# Patient Record
Sex: Female | Born: 1935
Health system: Southern US, Community
[De-identification: ages and names within clinical notes are randomized; demographics above are authoritative.]

## PROBLEM LIST (undated history)

## (undated) DIAGNOSIS — I471 Supraventricular tachycardia, unspecified: Secondary | ICD-10-CM

## (undated) DIAGNOSIS — C4490 Unspecified malignant neoplasm of skin, unspecified: Secondary | ICD-10-CM

## (undated) DIAGNOSIS — E785 Hyperlipidemia, unspecified: Secondary | ICD-10-CM

## (undated) DIAGNOSIS — E039 Hypothyroidism, unspecified: Secondary | ICD-10-CM

## (undated) HISTORY — PX: OTHER SURGICAL HISTORY: SHX169

## (undated) HISTORY — DX: Supraventricular tachycardia: I47.1

## (undated) HISTORY — DX: Supraventricular tachycardia, unspecified: I47.10

## (undated) HISTORY — DX: Unspecified malignant neoplasm of skin, unspecified: C44.90

## (undated) HISTORY — DX: Hypothyroidism, unspecified: E03.9

## (undated) HISTORY — PX: BUNIONECTOMY: SHX129

## (undated) HISTORY — DX: Hyperlipidemia, unspecified: E78.5

---

## 1997-11-07 ENCOUNTER — Ambulatory Visit (HOSPITAL_COMMUNITY): Admission: RE | Admit: 1997-11-07 | Discharge: 1997-11-07 | Payer: Self-pay | Admitting: *Deleted

## 2001-07-08 ENCOUNTER — Ambulatory Visit (HOSPITAL_COMMUNITY): Admission: RE | Admit: 2001-07-08 | Discharge: 2001-07-08 | Payer: Self-pay | Admitting: Gastroenterology

## 2003-06-26 ENCOUNTER — Ambulatory Visit (HOSPITAL_BASED_OUTPATIENT_CLINIC_OR_DEPARTMENT_OTHER): Admission: RE | Admit: 2003-06-26 | Discharge: 2003-06-26 | Payer: Self-pay | Admitting: Orthopedic Surgery

## 2010-12-11 ENCOUNTER — Emergency Department (HOSPITAL_COMMUNITY): Payer: Medicare Other

## 2010-12-11 ENCOUNTER — Emergency Department (HOSPITAL_COMMUNITY)
Admission: EM | Admit: 2010-12-11 | Discharge: 2010-12-11 | Disposition: A | Discharge status: Home / Self Care | Payer: Medicare Other | Attending: Emergency Medicine | Admitting: Emergency Medicine

## 2010-12-11 DIAGNOSIS — W010XXA Fall on same level from slipping, tripping and stumbling without subsequent striking against object, initial encounter: Secondary | ICD-10-CM | POA: Insufficient documentation

## 2010-12-11 DIAGNOSIS — Z23 Encounter for immunization: Secondary | ICD-10-CM | POA: Insufficient documentation

## 2010-12-11 DIAGNOSIS — F329 Major depressive disorder, single episode, unspecified: Secondary | ICD-10-CM | POA: Insufficient documentation

## 2010-12-11 DIAGNOSIS — E039 Hypothyroidism, unspecified: Secondary | ICD-10-CM | POA: Insufficient documentation

## 2010-12-11 DIAGNOSIS — F3289 Other specified depressive episodes: Secondary | ICD-10-CM | POA: Insufficient documentation

## 2010-12-11 DIAGNOSIS — M25579 Pain in unspecified ankle and joints of unspecified foot: Secondary | ICD-10-CM | POA: Insufficient documentation

## 2010-12-11 DIAGNOSIS — Y9301 Activity, walking, marching and hiking: Secondary | ICD-10-CM | POA: Insufficient documentation

## 2010-12-11 DIAGNOSIS — IMO0002 Reserved for concepts with insufficient information to code with codable children: Secondary | ICD-10-CM | POA: Insufficient documentation

## 2010-12-11 DIAGNOSIS — M25569 Pain in unspecified knee: Secondary | ICD-10-CM | POA: Insufficient documentation

## 2010-12-11 DIAGNOSIS — M25539 Pain in unspecified wrist: Secondary | ICD-10-CM | POA: Insufficient documentation

## 2010-12-11 DIAGNOSIS — S63509A Unspecified sprain of unspecified wrist, initial encounter: Secondary | ICD-10-CM | POA: Insufficient documentation

## 2011-06-03 ENCOUNTER — Other Ambulatory Visit: Payer: Self-pay

## 2011-06-03 ENCOUNTER — Emergency Department (HOSPITAL_COMMUNITY)
Admission: EM | Admit: 2011-06-03 | Discharge: 2011-06-03 | Disposition: A | Discharge status: Home / Self Care | Payer: Medicare Other | Attending: Emergency Medicine | Admitting: Emergency Medicine

## 2011-06-03 ENCOUNTER — Encounter (HOSPITAL_COMMUNITY): Payer: Self-pay | Admitting: *Deleted

## 2011-06-03 DIAGNOSIS — Z79899 Other long term (current) drug therapy: Secondary | ICD-10-CM | POA: Insufficient documentation

## 2011-06-03 DIAGNOSIS — I471 Supraventricular tachycardia, unspecified: Secondary | ICD-10-CM

## 2011-06-03 DIAGNOSIS — Z7982 Long term (current) use of aspirin: Secondary | ICD-10-CM | POA: Insufficient documentation

## 2011-06-03 DIAGNOSIS — E039 Hypothyroidism, unspecified: Secondary | ICD-10-CM | POA: Insufficient documentation

## 2011-06-03 DIAGNOSIS — R002 Palpitations: Secondary | ICD-10-CM | POA: Insufficient documentation

## 2011-06-03 MED ORDER — ADENOSINE 6 MG/2ML IV SOLN
INTRAVENOUS | Status: AC
  Administered 2011-06-03: 6 mg
  Filled 2011-06-03: qty 6

## 2011-06-03 MED ORDER — ADENOSINE 6 MG/2ML IV SOLN: 6.0000 mg | Freq: Once | INTRAVENOUS | Status: DC

## 2011-06-03 NOTE — ED Provider Notes (Signed)
History     CSN: [id number]  Arrival date & time 06/03/11  1139   First MD Initiated Contact with Patient 06/03/11 1150      Chief Complaint  Patient presents with  . Irregular Heart Beat    SVT    (Consider location/radiation/quality/duration/timing/severity/associated sxs/prior treatment) HPI Comments: Level 5 caveat due to urgent need for intervention. 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 She reports that she has a history of tachycardia but has never required any kind of medical intervention. She reports usually goes away on its own after a brief period of time. She reports no chest pain, tightness, shortness of breath, nausea, sweats. She reports that she has a history of hypothyroidism and she takes Synthroid. She reports that she did drink 3 cups of coffee this morning. Otherwise she denies fevers, chills, sore throat, cough, abdominal pain or back pain. She denies any long distance travel recently and no lower extremity swelling or calf tenderness. Patient reports she took her pulse and she thought that it was around 120. She spoke to someone there in the nursing unit who took her pulse and vital signs and found that her heart rate was much faster than 120 and the patient was urged to come down here to the emergency department. Currently on the monitor her heart rate is 180.  The history is provided by the patient.    Past Medical History  Diagnosis Date  . Tachycardia   . Thyroid disease     History reviewed. No pertinent past surgical history.  No family history on file.  History  Substance Use Topics  . Smoking status: Never Smoker   . Smokeless tobacco: Not on file  . Alcohol Use: No    OB History    Grav Para Term Preterm Abortions TAB SAB Ect Mult Living                  Review of Systems  Unable to perform ROS Cardiovascular: Positive for palpitations.    All other systems reviewed and are negative.    Allergies  Codeine  Home Medications   Current Outpatient Rx  Name Route Sig Dispense Refill  . ASPIRIN 81 MG PO TABS Oral Take 160 mg by mouth daily.    Marland Kitchen VITAMIN D 1000 UNITS PO TABS Oral Take 1,000 Units by mouth daily.    Marland Kitchen LEVOTHYROXINE SODIUM 100 MCG PO TABS Oral Take 100 mcg by mouth daily.    Carma Leaven M PLUS PO TABS Oral Take 1 tablet by mouth daily.      BP 122/79  Pulse 96  Resp 18  SpO2 96%  Physical Exam  Vitals reviewed. Constitutional: She is oriented to person, place, and time. She appears well-developed and well-nourished.  HENT:  Head: Normocephalic and atraumatic.  Eyes: Pupils are equal, round, and reactive to light.  Neck: Normal range of motion. Neck supple.  Cardiovascular:  No extrasystoles are present. Tachycardia present.   No murmur heard. Pulmonary/Chest: Effort normal. No respiratory distress. She has no wheezes.  Abdominal: Soft. She exhibits no distension. There is no tenderness.  Musculoskeletal: She exhibits no edema and no tenderness.  Neurological: She is alert and oriented to person, place, and time.  Skin: Skin is warm and dry. No pallor.  Psychiatric: She has a normal mood and affect.    ED Course  CARDIOVERSION Date/Time: 06/03/2011 12:02  PM Performed by: Lear Ng Authorized by: Lear Ng Consent: Verbal consent obtained. Risks and benefits: risks, benefits and alternatives were discussed Consent given by: patient Patient understanding: patient states understanding of the procedure being performed Relevant documents: relevant documents present and verified Imaging studies: imaging studies not available Patient identity confirmed: verbally with patient Time out: Immediately prior to procedure a "time out" was called to verify the correct patient, procedure, equipment, support staff and site/side marked as required. Patient sedated: no Cardioversion basis:  emergent Pre-procedure rhythm: supraventricular tachycardia Number of attempts: 1 Attempt 1 outcome: conversion to normal sinus rhythm Post-procedure rhythm: normal sinus rhythm Patient tolerance: Patient tolerated the procedure well with no immediate complications. Comments: 6 mg adenosine bolus was used, successfully   (including critical care time)  Labs Reviewed - No data to display No results found.   No diagnosis found. Room air saturation was 96% which is normal.   MDM   Patient's EKG performed at 11:41, shows a supraventricular tachycardia at a rate of 180. Axis is normal. There is some nonspecific ST depression noted inferiorly. No prior EKGs are currently available.  Patient denied any current chest pain however given her age, I did not want the patient to remain at such a rapid heart rate for a prolonged period of time. Clinically, I did not think that this was a acute thyrotoxicosis picture, and I did not think this was acute coronary syndrome. The patient was given an injection of 6 mg of adenosine with rapid saline flush. Patient's monitor rhythm became a sinus bradycardia which then turned into a sinus tachycardia. Most recently, the patient remains in a normal sinus rhythm. Repeat EKG performed at 12:16, shows normal sinus rhythm at a rate of 94. There is early RS transition noted in lead V2. There is again normal axis and ST abnormalities seen inferiorly are resolved.  Patient remains in stable condition with stable vital signs and she reports feeling much improved. She has been monitored here for more than one hour with no recurrence of her symptoms. She is told to decrease her caffeine intake. She can followup with her primary care physician Dr. Wylene Simmer next week as needed.      1:43 PM Patient remains hemodynamically stable. She reports no chest tightness, chest pain, shortness of breath. Her heart rate remains in a normal sinus rhythm in the 90s. I've spoken to her  primary care physician whose office will contact her tomorrow to arrange close outpatient followup. She and her family know to return for any worsening symptoms or problems. She'll be discharged home at this time the  Gavin Pound. Gae Bihl, MD 06/03/11 1347

## 2011-06-03 NOTE — ED Notes (Signed)
Pt sts that heart stated racing about 1045.  Pt used to take inderal for tachycardia before, but not now.   Pt reports something feeling in chest but cannot describe it as chest pain

## 2011-06-03 NOTE — Discharge Instructions (Signed)
 Supraventricular Tachycardia Supraventricular tachycardia (SVT) is an abnormal heart rhythm (arrhythmia) that causes the heart to beat very fast (tachycardia). This kind of fast heart beat originates in the upper chambers of the heart (atria). SVT can cause the heart to beat greater than 100 beats per minute. SVT can have a rapid burst of heartbeats. This can start and stop suddenly without warning and is called non-sustained. SVT can also be sustained, in which the heart beats at a continuous fast rate.  CAUSES  There can be different causes of SVT. Some of these include:  Heart valve problems such as mitral valve prolapse.   An enlarged heart (hypertrophic cardiomyopathy).   Congenital heart problems.   Heart inflammation (pericarditis).   Hyperthyroidism.   Low potassium or magnesium levels.   Caffeine.   Drug use such as cocaine, methamphetamines, or stimulants.   Some over-the-counter medications such as:   Decongestants.   Diet medications.   Herbal medications.  SYMPTOMS  Symptoms of SVT can vary. Symptoms depend on if the SVT is sustained or non-sustained. These can include:  No symptoms (asymptomatic).   An awareness of your heart beating rapidly (palpitations).   Shortness of breath.   Chest pain or pressure.   If your blood pressure drops because of the SVT, you may experience:   Fainting or near fainting.   Weakness.   Dizziness.  DIAGNOSIS  Different tests can be performed to diagnose SVT, such as:  An electrocardiogram (EKG) is a painless test that records the electrical activity of your heart.   Holter monitor. This is a 24 hour recording of your heart rhythm. You will be given a diary. Write down all symptoms that you have and what you were doing at the time you experienced symptoms.   Arrhythmia monitor. This is a small device that your wear for several weeks. It records the heart rhythm when you have symptoms.   Echocardiogram. This is an  imaging test to help detect abnormal heart structure such as congenital abnormalities, heart valve problems, or if your heart is enlarged.   Stress Test. This test can help determine if the SVT is related to exercise.   Electrophysiology Study (EPS). This is a procedure that evaluates your heart's electrical system and can help your caregiver find the cause of SVT.  TREATMENT  Treatment of SVT depends on the symptoms, how often it recurs and whether there are any underlying heart problems.   If symptoms are rare and no other cardiac disease is present, no treatment may be needed.   Blood work may be done to check potassium, magnesium and thyroid  hormone levels to see if they are abnormal. If these levels are abnormal, treatment to correct the problems will occur.  Medications: Your caregiver may use oral medications to treat SVT. These medications are given for long-term control of SVT. Medications may be used alone or in combination with other treatments. These medications work to slow nerve impulses in the heart muscle. These medications can also be used to treat high blood pressure. Some of these medications may include:  Calcium channel blockers.   Beta blockers.   Lanoxin.  Nonsurgical procedures: Nonsurgical techniques may be used if oral medications do not work. Some examples include:  Cardioversion. This technique uses either drugs or an electrical shock to restore a normal heart rhythm.   Cardioversion drugs may be given through an intravenous (IV) line to help reset the heart rhythm.   In electrical cardio version, the doctor shocks  your heart to stop its beat for a split second. This helps to reset the heart to a normal rhythm.   Ablation. This procedure is done under mild sedation. High frequency radio-wave energy is used to destroy the area of heart tissue responsible for the SVT.  HOME CARE INSTRUCTIONS   Do not smoke.   Only take medications prescribed by your  caregiver. Check with your caregiver before using over-the-counter medications.   Check with your caregiver as to how much alcohol  and caffeine (coffee, tea, colas, or chocolate) you may have.   It is very important to keep all follow-up referrals and appointments in order to properly manage this problem.  SEEK IMMEDIATE MEDICAL CARE IF:  You have dizziness.   You faint or nearly faint.   You have shortness of breath.   You have chest pain or pressure.   You have sudden nausea or vomiting.   You have profuse sweating.   You are concerned about how long your symptoms last.   You are concerned about the frequency of your SVT episodes.  If you have the above symptoms, call your local emergency service immediately! Do not drive yourself to the hospital. MAKE SURE YOU:   Understand these instructions.   Will watch your condition.   Will get help right away if you are not doing well or get worse.  Document Released: 04/27/2005 Document Revised: 11/10/2010 Document Reviewed: 08/09/2008 [facility] Patient Information 2012 [place], [place].Supraventricular Tachycardia Supraventricular tachycardia (SVT) is an abnormal heart rhythm (arrhythmia) that causes the heart to beat very fast (tachycardia). This kind of fast heart beat originates in the upper chambers of the heart (atria). SVT can cause the heart to beat greater than 100 beats per minute. SVT can have a rapid burst of heartbeats. This can start and stop suddenly without warning and is called non-sustained. SVT can also be sustained, in which the heart beats at a continuous fast rate.  CAUSES  There can be different causes of SVT. Some of these include:  Heart valve problems such as mitral valve prolapse.   An enlarged heart (hypertrophic cardiomyopathy).   Congenital heart problems.   Heart inflammation (pericarditis).   Hyperthyroidism.   Low potassium or magnesium levels.   Caffeine.   Drug use such as cocaine,  methamphetamines, or stimulants.   Some over-the-counter medications such as:   Decongestants.   Diet medications.   Herbal medications.  SYMPTOMS  Symptoms of SVT can vary. Symptoms depend on if the SVT is sustained or non-sustained. These can include:  No symptoms (asymptomatic).   An awareness of your heart beating rapidly (palpitations).   Shortness of breath.   Chest pain or pressure.   If your blood pressure drops because of the SVT, you may experience:   Fainting or near fainting.   Weakness.   Dizziness.  DIAGNOSIS  Different tests can be performed to diagnose SVT, such as:  An electrocardiogram (EKG) is a painless test that records the electrical activity of your heart.   Holter monitor. This is a 24 hour recording of your heart rhythm. You will be given a diary. Write down all symptoms that you have and what you were doing at the time you experienced symptoms.   Arrhythmia monitor. This is a small device that your wear for several weeks. It records the heart rhythm when you have symptoms.   Echocardiogram. This is an imaging test to help detect abnormal heart structure such as congenital abnormalities, heart valve problems, or if  your heart is enlarged.   Stress Test. This test can help determine if the SVT is related to exercise.   Electrophysiology Study (EPS). This is a procedure that evaluates your heart's electrical system and can help your caregiver find the cause of SVT.  TREATMENT  Treatment of SVT depends on the symptoms, how often it recurs and whether there are any underlying heart problems.   If symptoms are rare and no other cardiac disease is present, no treatment may be needed.   Blood work may be done to check potassium, magnesium and thyroid  hormone levels to see if they are abnormal. If these levels are abnormal, treatment to correct the problems will occur.  Medications: Your caregiver may use oral medications to treat SVT. These  medications are given for long-term control of SVT. Medications may be used alone or in combination with other treatments. These medications work to slow nerve impulses in the heart muscle. These medications can also be used to treat high blood pressure. Some of these medications may include:  Calcium channel blockers.   Beta blockers.   Lanoxin.  Nonsurgical procedures: Nonsurgical techniques may be used if oral medications do not work. Some examples include:  Cardioversion. This technique uses either drugs or an electrical shock to restore a normal heart rhythm.   Cardioversion drugs may be given through an intravenous (IV) line to help reset the heart rhythm.   In electrical cardio version, the doctor shocks your heart to stop its beat for a split second. This helps to reset the heart to a normal rhythm.   Ablation. This procedure is done under mild sedation. High frequency radio-wave energy is used to destroy the area of heart tissue responsible for the SVT.  HOME CARE INSTRUCTIONS   Do not smoke.   Only take medications prescribed by your caregiver. Check with your caregiver before using over-the-counter medications.   Check with your caregiver as to how much alcohol  and caffeine (coffee, tea, colas, or chocolate) you may have.   It is very important to keep all follow-up referrals and appointments in order to properly manage this problem.  SEEK IMMEDIATE MEDICAL CARE IF:  You have dizziness.   You faint or nearly faint.   You have shortness of breath.   You have chest pain or pressure.   You have sudden nausea or vomiting.   You have profuse sweating.   You are concerned about how long your symptoms last.   You are concerned about the frequency of your SVT episodes.  If you have the above symptoms, call your local emergency service immediately! Do not drive yourself to the hospital. MAKE SURE YOU:   Understand these instructions.   Will watch your condition.     Will get help right away if you are not doing well or get worse.  Document Released: 04/27/2005 Document Revised: 11/10/2010 Document Reviewed: 08/09/2008 [facility] Patient Information 2012 [place], [place].

## 2011-06-26 ENCOUNTER — Encounter: Payer: Self-pay | Admitting: Cardiology

## 2011-06-26 ENCOUNTER — Ambulatory Visit (INDEPENDENT_AMBULATORY_CARE_PROVIDER_SITE_OTHER): Payer: Medicare Other | Admitting: Cardiology

## 2011-06-26 VITALS — BP 140/78 | HR 72 | Ht 63.0 in | Wt 142.0 lb

## 2011-06-26 DIAGNOSIS — I471 Supraventricular tachycardia: Secondary | ICD-10-CM

## 2011-06-26 DIAGNOSIS — I498 Other specified cardiac arrhythmias: Secondary | ICD-10-CM

## 2011-06-26 DIAGNOSIS — I10 Essential (primary) hypertension: Secondary | ICD-10-CM | POA: Insufficient documentation

## 2011-06-26 NOTE — Assessment & Plan Note (Signed)
We discussed the pathophysiology of this. At this point this does not seem to be overly burdensome were symptomatic. I have asked her to pay particular attention to the frequency and duration of these episodes. I would consider event monitoring and we did discuss ablation which at this point is not indicated. I reviewed with her vagal maneuvers. I will check an echocardiogram though I suspect she has a structurally normal heart.

## 2011-06-26 NOTE — Progress Notes (Signed)
HPI The patient presents for evaluation of tachycardia. She has had no past cardiac history. However, she has noted for years some tachycardia palpitations. It happens he stopped what she's doing and rests it will go away. She's not quantified before. It's never caused chest discomfort presyncope or syncope. She doesn't think is getting any worse. 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 She asked one of her coworkers checked her pulse and because he was 180 they sent her to the emergency room. There, she was noted to have SVT with a rate of 180.  She was cardioverted with adenosine. Since that time she hasn't had any sustained episodes as far as she can recall. She says her thyroid was checked and it was normal. I have reviewed the emergency room records and other labs were unremarkable. She denies any chest pressure, neck or arm discomfort. She doesn't have any excessive shortness of breath, PND or orthopnea. She does walk for exercise. She doesn't sleep well and has had some stress in her life. She does feel fatigued more than she used to.   Allergies  Allergen Reactions  . Codeine Nausea And Vomiting    Current Outpatient Prescriptions  Medication Sig Dispense Refill  . aspirin 81 MG tablet Take 160 mg by mouth daily.      . cholecalciferol (VITAMIN D) 1000 UNITS tablet Take 1,000 Units by mouth daily.      Marland Kitchen levothyroxine (SYNTHROID, LEVOTHROID) 100 MCG tablet Take 100 mcg by mouth daily.      . metoprolol succinate (TOPROL-XL) 25 MG 24 hr tablet Take 25 mg by mouth daily.      . Multiple Vitamins-Minerals (MULTIVITAMINS THER. W/MINERALS) TABS Take 1 tablet by mouth daily.        Past Medical History  Diagnosis Date  . Tachycardia   . Thyroid disease     No past surgical history on file.  No family history on file.  History   Social History  . Marital Status: Married    Spouse Name: N/A   Number of Children: N/A  . Years of Education: N/A   Occupational History  . Not on file.   Social History Main Topics  . Smoking status: Never Smoker   . Smokeless tobacco: Not on file  . Alcohol Use: No  . Drug Use: No  . Sexually Active:    Other Topics Concern  . Not on file   Social History Narrative  . No narrative on file    ROS:  Positive for joint pains and rhinitis. Otherwise as stated in the HPI and negative for all other systems.  PHYSICAL EXAM BP 140/78  Pulse 72  Ht 5\' 3"  (1.6 m)  Wt 142 lb (64.411 kg)  BMI 25.15 kg/m2 GENERAL:  Well appearing HEENT:  Pupils equal round and reactive, fundi not visualized, oral mucosa unremarkable NECK:  No jugular venous distention, waveform within normal limits, carotid upstroke brisk and symmetric, no bruits, no thyromegaly LYMPHATICS:  No cervical, inguinal adenopathy LUNGS:  Clear to auscultation bilaterally BACK:  No CVA tenderness CHEST:  Unremarkable HEART:  PMI not displaced or sustained,S1 and S2 within normal limits, no S3, no S4, no clicks, no rubs, no murmurs ABD:  Flat, positive bowel sounds normal in frequency in pitch, no bruits, no rebound, no guarding, no midline pulsatile mass, no hepatomegaly, no splenomegaly EXT:  2 plus pulses throughout, no edema, no cyanosis  no clubbing SKIN:  No rashes no nodules NEURO:  Cranial nerves II through XII grossly intact, motor grossly intact throughout PSYCH:  Cognitively intact, oriented to person place and time   ASSESSMENT AND PLAN

## 2011-06-26 NOTE — Patient Instructions (Signed)
Your physician has requested that you have an echocardiogram. Echocardiography is a painless test that uses sound waves to create images of your heart. It provides your doctor with information about the size and shape of your heart and how well your heart's chambers and valves are working. This procedure takes approximately one hour. There are no restrictions for this procedure.  The current medical regimen is effective;  continue present plan and medications.  Follow up with Dr Antoine Poche in 4 months

## 2011-06-26 NOTE — Assessment & Plan Note (Signed)
Her blood pressure is upper limits of normal. She does not have a functioning blood pressure cuff but I discussed that it might be reasonable to get one of these. She will keep a blood pressure diary.

## 2011-07-02 ENCOUNTER — Other Ambulatory Visit: Payer: Self-pay

## 2011-07-02 ENCOUNTER — Ambulatory Visit (HOSPITAL_COMMUNITY): Payer: Medicare Other | Attending: Cardiology

## 2011-07-02 DIAGNOSIS — I079 Rheumatic tricuspid valve disease, unspecified: Secondary | ICD-10-CM | POA: Insufficient documentation

## 2011-07-02 DIAGNOSIS — I10 Essential (primary) hypertension: Secondary | ICD-10-CM | POA: Insufficient documentation

## 2011-07-02 DIAGNOSIS — I471 Supraventricular tachycardia, unspecified: Secondary | ICD-10-CM | POA: Insufficient documentation

## 2011-08-11 ENCOUNTER — Encounter: Payer: Self-pay | Admitting: Cardiovascular Disease

## 2011-08-11 ENCOUNTER — Ambulatory Visit (INDEPENDENT_AMBULATORY_CARE_PROVIDER_SITE_OTHER): Payer: Medicare Other | Admitting: Cardiovascular Disease

## 2011-08-11 VITALS — BP 110/70 | HR 70 | Ht 63.0 in | Wt 148.8 lb

## 2011-08-11 DIAGNOSIS — I498 Other specified cardiac arrhythmias: Secondary | ICD-10-CM

## 2011-08-11 DIAGNOSIS — I471 Supraventricular tachycardia: Secondary | ICD-10-CM

## 2011-08-11 NOTE — Assessment & Plan Note (Signed)
Patient is asymptomatic at present. She inquired about whether she needed to be on metoprolol succinate long-term. She is tolerating this well and her SVT has been quiescent, I advised her to stay on it. She will followup in one year or sooner if problems arise. If she has recurrent episodes on medical therapy I will plan on referring her to EP.

## 2011-08-11 NOTE — Progress Notes (Signed)
   HPI:  77 year old woman presented for followup evaluation. She was treated for supraventricular tachycardia in the emergency room back in January 2013. The patient was cardioverted with IV adenosine after presenting with chest tightness and palpitations. She has had no recurrence since that time. She was started on long-acting metoprolol. 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 She denies chest pain or pressure with exertion. She denies dyspnea, edema, or lightheadedness. She had an echo showing no abnormalities.  Outpatient Encounter Prescriptions as of 08/11/2011  Medication Sig Dispense Refill  . aspirin 81 MG tablet Take 160 mg by mouth daily.      . calcium carbonate (OS-CAL) 600 MG TABS Take 600 mg by mouth daily.      . cholecalciferol (VITAMIN D) 1000 UNITS tablet Take 1,000 Units by mouth daily.      Marland Kitchen levothyroxine (SYNTHROID, LEVOTHROID) 100 MCG tablet Take 100 mcg by mouth daily.      . metoprolol succinate (TOPROL-XL) 25 MG 24 hr tablet Take 25 mg by mouth daily.      . Multiple Vitamins-Minerals (MULTIVITAMINS THER. W/MINERALS) TABS Take 1 tablet by mouth daily.        Allergies  Allergen Reactions  . Codeine Nausea And Vomiting    Past Medical History  Diagnosis Date  . SVT (supraventricular tachycardia)   . Hypothyroidism   . Osteoporosis   . Dyslipidemia   . Skin cancer     ROS: Negative except as per HPI  BP 110/70  Pulse 70  Ht 5\' 3"  (1.6 m)  Wt 67.495 kg (148 lb 12.8 oz)  BMI 26.36 kg/m2  PHYSICAL EXAM: Pt is alert and oriented, pleasant woman in NAD HEENT: normal Neck: JVP - normal, carotids 2+= without bruits Lungs: CTA bilaterally CV: RRR without murmur or gallop Abd: soft, NT, Positive BS, no hepatomegaly Ext: no C/C/E, distal pulses intact and equal Skin: warm/dry no rash  ASSESSMENT AND PLAN:

## 2011-08-11 NOTE — Patient Instructions (Signed)
Your physician wants you to follow-up in: 1 YEAR with Dr Cooper.  You will receive a reminder letter in the mail two months in advance. If you don't receive a letter, please call our office to schedule the follow-up appointment.  Your physician recommends that you continue on your current medications as directed. Please refer to the Current Medication list given to you today.  

## 2011-11-18 ENCOUNTER — Telehealth: Payer: Self-pay | Admitting: Cardiovascular Disease

## 2011-11-18 NOTE — Telephone Encounter (Signed)
Left message to call back  

## 2011-11-18 NOTE — Telephone Encounter (Signed)
I spoke with the pt and she complains of lower extremity edema. The left leg is a little worse than the right.  The pt is leaving 7/24 to go on a cruise to [place].  The pt said she has been on her feet more than usual due to preparing for trip.  The pt does watch her salt consumption and tries to elevate her feet when able.  The pt said she has had an episode of swelling in the past and it just resolved on it's own.  The pt denies SOB, CP or palpitations at this time.  I have scheduled the pt to come into the office on 11/25/11 for further evaluation.   Pt agreed with plan and will try to put her feet up more often.

## 2011-11-18 NOTE — Telephone Encounter (Signed)
New msg Pt said her feet is swelling. She wanted to talk to you. Please call

## 2011-11-18 NOTE — Telephone Encounter (Signed)
Follow-up:    Patient returned your call.  Please call back. 

## 2011-11-25 ENCOUNTER — Ambulatory Visit: Payer: Medicare Other | Admitting: Nurse Practitioner

## 2012-08-10 ENCOUNTER — Encounter: Payer: Self-pay | Admitting: Cardiovascular Disease

## 2012-08-10 ENCOUNTER — Ambulatory Visit (INDEPENDENT_AMBULATORY_CARE_PROVIDER_SITE_OTHER): Payer: Medicare Other | Admitting: Cardiovascular Disease

## 2012-08-10 VITALS — BP 98/64 | HR 84 | Ht 63.0 in | Wt 142.0 lb

## 2012-08-10 DIAGNOSIS — I498 Other specified cardiac arrhythmias: Secondary | ICD-10-CM

## 2012-08-10 DIAGNOSIS — I471 Supraventricular tachycardia: Secondary | ICD-10-CM

## 2012-08-10 NOTE — Patient Instructions (Addendum)
Your physician wants you to follow-up in: 1 YEAR with Dr Cooper.  You will receive a reminder letter in the mail two months in advance. If you don't receive a letter, please call our office to schedule the follow-up appointment.  Your physician recommends that you continue on your current medications as directed. Please refer to the Current Medication list given to you today.  

## 2012-08-10 NOTE — Progress Notes (Signed)
   HPI:   78 year old woman presenting for followup evaluation. She has a history of supraventricular tachycardia. She had an episode in January 2013 when she was treated with IV adenosine and successfully converted back to sinus rhythm. She was started on long-acting metoprolol at that time.  She is doing well. She's had no palpitations, lightheadedness, edema, or chest pain. She used to get palpitations in stressful situations, but this is resolved since she started metoprolol succinate.  Outpatient Encounter Prescriptions as of 08/10/2012  Medication Sig Dispense Refill  . aspirin 81 MG tablet Take 160 mg by mouth daily.      . calcium carbonate (OS-CAL) 600 MG TABS Take 600 mg by mouth daily.      . cholecalciferol (VITAMIN D) 1000 UNITS tablet Take 1,000 Units by mouth daily.      Marland Kitchen levothyroxine (SYNTHROID, LEVOTHROID) 100 MCG tablet Take 100 mcg by mouth daily.      . metoprolol succinate (TOPROL-XL) 25 MG 24 hr tablet Take 25 mg by mouth daily.      . Multiple Vitamins-Minerals (MULTIVITAMINS THER. W/MINERALS) TABS Take 1 tablet by mouth daily.      . VESICARE 5 MG tablet 5 mg.        No facility-administered encounter medications on file as of 08/10/2012.    Allergies  Allergen Reactions  . Codeine Nausea And Vomiting    Past Medical History  Diagnosis Date  . SVT (supraventricular tachycardia)   . Hypothyroidism   . Osteoporosis   . Dyslipidemia   . Skin cancer     ROS: Negative except as per HPI  BP 98/64  Pulse 84  Ht 5\' 3"  (1.6 m)  Wt 142 lb (64.411 kg)  BMI 25.16 kg/m2  PHYSICAL EXAM: Pt is alert and oriented, NAD HEENT: normal Neck: JVP - normal, carotids 2+= without bruits Lungs: CTA bilaterally CV: RRR without murmur or gallop Abd: soft, NT, Positive BS, no hepatomegaly Ext: no C/C/E, distal pulses intact and equal Skin: warm/dry no rash  EKG:  Normal sinus rhythm 84 beats per minute, within normal limits.  ASSESSMENT AND PLAN: Paroxysmal SVT. This  is quiescent on metoprolol succinate. Echo last year was normal. Continue same medical program. Follow-up in one year.  Tonny Bollman 08/10/2012 3:51 PM

## 2012-10-10 ENCOUNTER — Encounter (HOSPITAL_COMMUNITY): Payer: Self-pay | Admitting: Emergency Medicine

## 2012-10-10 ENCOUNTER — Emergency Department (HOSPITAL_COMMUNITY)
Admission: EM | Admit: 2012-10-10 | Discharge: 2012-10-10 | Disposition: A | Discharge status: Home / Self Care | Payer: Medicare Other | Source: Home / Self Care | Attending: Emergency Medicine | Admitting: Emergency Medicine

## 2012-10-10 DIAGNOSIS — L509 Urticaria, unspecified: Secondary | ICD-10-CM

## 2012-10-10 MED ORDER — PREDNISONE 20 MG PO TABS: 40.0000 mg | ORAL_TABLET | Freq: Every day | ORAL | Status: DC

## 2012-10-10 MED ORDER — HYDROXYZINE HCL 25 MG PO TABS: 25.0000 mg | ORAL_TABLET | Freq: Four times a day (QID) | ORAL | Status: DC

## 2012-10-10 NOTE — ED Notes (Signed)
Pt c/o hives onset 1700 today... Reports working in the yard and started to notice the hives on arms, chest and abdomen Also reports having a tick bite a couple of days ago and just getting over poison ivy Denies: fevers... Not taking any meds for sxs She is alert and oriented w/no signs of acute distress.

## 2012-10-10 NOTE — ED Provider Notes (Signed)
History     CSN: [id number]  Arrival date & time 10/10/12  [place]   First MD Initiated Contact with Patient 10/10/12 1937      Chief Complaint  Patient presents with  . Rash    (Consider location/radiation/quality/duration/timing/severity/associated sxs/prior treatment) HPI Comments: Patient presents to urgent care this evening reporting that a few hours ago after she was working outside in your she started noticing hives and itchy rash in both arms and later extended to her chest and abdomen. Some of her arms have faded away at this point but she's having some worsening itchiness on her abdomen and back. She denies any facial swelling difficulty swallowing or breathing. Denies any fevers generalized malaise arthralgias or myalgias.  Last week she developed poison ivy and she was also working outdoors. Her rash was getting much better  Patient is a 78 y.o. female presenting with rash. The history is provided by the patient.  Rash Pain location:  Generalized Pain quality: not burning and not cramping   Pain severity:  No pain Onset quality:  Sudden Duration:  6 hours Chronicity:  New Relieved by:  Nothing Worsened by:  Nothing tried Associated symptoms: no anorexia, no belching, no chills, no cough, no diarrhea, no dysuria, no fatigue, no fever, no hematemesis, no hematuria, no nausea, no shortness of breath, no sore throat, no vaginal bleeding, no vaginal discharge and no vomiting     Past Medical History  Diagnosis Date  . SVT (supraventricular tachycardia)   . Hypothyroidism   . Osteoporosis   . Dyslipidemia   . Skin cancer     Past Surgical History  Procedure Laterality Date  . Bunionectomy    . Hammer toe repair      Family History  Problem Relation Age of Onset  . Stroke Mother 72  . Alcohol abuse Father 63    History  Substance Use Topics  . Smoking status: Never Smoker   . Smokeless tobacco: Not on file  . Alcohol Use: No    OB History   Grav Para Term  Preterm Abortions TAB SAB Ect Mult Living                  Review of Systems  Constitutional: Negative for fever, chills, activity change, appetite change and fatigue.  HENT: Negative for nosebleeds, congestion, sore throat, neck pain, dental problem, postnasal drip and sinus pressure.   Respiratory: Negative for cough, choking and shortness of breath.   Gastrointestinal: Negative for nausea, vomiting, diarrhea, anorexia and hematemesis.  Genitourinary: Negative for dysuria, hematuria, vaginal bleeding and vaginal discharge.  Musculoskeletal: Negative for myalgias, back pain and arthralgias.  Skin: Positive for rash.  Allergic/Immunologic: Negative for environmental allergies.    Allergies  Codeine  Home Medications   Current Outpatient Rx  Name  Route  Sig  Dispense  Refill  . aspirin 81 MG tablet   Oral   Take 160 mg by mouth daily.         . calcium carbonate (OS-CAL) 600 MG TABS   Oral   Take 600 mg by mouth daily.         . cholecalciferol (VITAMIN D) 1000 UNITS tablet   Oral   Take 1,000 Units by mouth daily.         . hydrOXYzine (ATARAX/VISTARIL) 25 MG tablet   Oral   Take 1 tablet (25 mg total) by mouth every 6 (six) hours.   12 tablet   0   . levothyroxine (SYNTHROID,  LEVOTHROID) 100 MCG tablet   Oral   Take 100 mcg by mouth daily.         . metoprolol succinate (TOPROL-XL) 25 MG 24 hr tablet   Oral   Take 25 mg by mouth daily.         . Multiple Vitamins-Minerals (MULTIVITAMINS THER. W/MINERALS) TABS   Oral   Take 1 tablet by mouth daily.         . predniSONE (DELTASONE) 20 MG tablet   Oral   Take 2 tablets (40 mg total) by mouth daily.   14 tablet   0   . VESICARE 5 MG tablet      5 mg.            BP 138/76  Pulse 60  Temp(Src) 97.6 F (36.4 C) (Oral)  Resp 16  SpO2 98%  Physical Exam  Nursing note and vitals reviewed. Constitutional: Vital signs are normal. She appears well-developed and well-nourished.  Non-toxic  appearance. She does not have a sickly appearance. She does not appear ill. No distress.  HENT:  Head: Normocephalic.  Musculoskeletal: She exhibits no edema and no tenderness.  Neurological: She is alert.  Skin: Skin is warm. Rash noted. There is erythema.       ED Course  Procedures (including critical care time)  Labs Reviewed - No data to display No results found.   1. Urticaria       MDM  Symptoms and exam consistent with urticaria, have prescribed patient a course of 7 days of prednisone along with hydroxyzine.        Jimmie Molly, MD 10/10/12 2051

## 2013-08-21 ENCOUNTER — Ambulatory Visit (INDEPENDENT_AMBULATORY_CARE_PROVIDER_SITE_OTHER): Payer: Medicare Other | Admitting: Cardiovascular Disease

## 2013-08-21 ENCOUNTER — Encounter: Payer: Self-pay | Admitting: Cardiovascular Disease

## 2013-08-21 VITALS — BP 110/58 | HR 70 | Ht 63.0 in | Wt 139.0 lb

## 2013-08-21 DIAGNOSIS — I498 Other specified cardiac arrhythmias: Secondary | ICD-10-CM

## 2013-08-21 DIAGNOSIS — I471 Supraventricular tachycardia: Secondary | ICD-10-CM

## 2013-08-21 NOTE — Progress Notes (Signed)
    HPI:  79 year old woman presenting for followup evaluation. She has a history of supraventricular tachycardia. She had an episode in January 2013 when she was treated with IV adenosine and successfully converted back to sinus rhythm. She was started on long-acting metoprolol at that time. When I saw her last one year ago she had been doing well without palpitations.  The patient has had a good year. She has had problems with her voice and has undergone ENT evaluation. She's had a trial of a proton pump inhibitor but this did not help. Other than that, she has no complaints. She specifically denies chest pain, shortness of breath, edema, orthopnea, PND, lightheadedness, or syncope. She's had one episode of palpitations that was associated with stress and fatigue. This was self-limited. No problems in the last several months.   Outpatient Encounter Prescriptions as of 08/21/2013  Medication Sig  . aspirin 81 MG tablet Take 160 mg by mouth daily.  . calcium carbonate (OS-CAL) 600 MG TABS Take 600 mg by mouth daily.  . cholecalciferol (VITAMIN D) 1000 UNITS tablet Take 1,000 Units by mouth daily.  Marland Kitchen levothyroxine (SYNTHROID, LEVOTHROID) 100 MCG tablet Take 100 mcg by mouth daily.  . metoprolol succinate (TOPROL-XL) 25 MG 24 hr tablet Take 25 mg by mouth daily.  . Multiple Vitamins-Minerals (MULTIVITAMINS THER. W/MINERALS) TABS Take 1 tablet by mouth daily.  . VESICARE 5 MG tablet 5 mg.   . [DISCONTINUED] fluocinonide (LIDEX) 0.05 % external solution   . [DISCONTINUED] hydrOXYzine (ATARAX/VISTARIL) 25 MG tablet Take 1 tablet (25 mg total) by mouth every 6 (six) hours.  . [DISCONTINUED] predniSONE (DELTASONE) 20 MG tablet Take 2 tablets (40 mg total) by mouth daily.    Allergies  Allergen Reactions  . Codeine Nausea And Vomiting    Past Medical History  Diagnosis Date  . SVT (supraventricular tachycardia)   . Hypothyroidism   . Osteoporosis   . Dyslipidemia   . Skin cancer    BP 110/58   Pulse 70  Ht 5\' 3"  (1.6 m)  Wt 139 lb (63.05 kg)  BMI 24.63 kg/m2  PHYSICAL EXAM: Pt is alert and oriented, NAD HEENT: normal Neck: JVP - normal, carotids 2+= without bruits Lungs: CTA bilaterally CV: RRR without murmur or gallop Abd: soft, NT, Positive BS, no hepatomegaly Ext: no C/C/E, distal pulses intact and equal Skin: warm/dry no rash  EKG:  Normal sinus rhythm 71 beats per minute, within normal limits.  ASSESSMENT AND PLAN: Paroxysmal SVT. Only one episode of palpitations in the last year, and this was self-limited. She seems to be doing very well on metoprolol succinate and she will continue with this. I will see her back in one year for followup evaluation. Known to have normal LV function. She will call if any problems arise.  Sherren Mocha 08/21/2013 9:08 AM

## 2013-08-21 NOTE — Patient Instructions (Signed)
Your physician wants you to follow-up in: 1 YEAR with Dr Cooper.  You will receive a reminder letter in the mail two months in advance. If you don't receive a letter, please call our office to schedule the follow-up appointment.  Your physician recommends that you continue on your current medications as directed. Please refer to the Current Medication list given to you today.  

## 2014-04-19 ENCOUNTER — Emergency Department (HOSPITAL_COMMUNITY): Payer: Medicare Other

## 2014-04-19 ENCOUNTER — Emergency Department (HOSPITAL_COMMUNITY)
Admission: EM | Admit: 2014-04-19 | Discharge: 2014-04-19 | Disposition: A | Discharge status: Home / Self Care | Payer: Medicare Other | Attending: Emergency Medicine | Admitting: Emergency Medicine

## 2014-04-19 ENCOUNTER — Encounter (HOSPITAL_COMMUNITY): Payer: Self-pay | Admitting: Emergency Medicine

## 2014-04-19 DIAGNOSIS — R072 Precordial pain: Secondary | ICD-10-CM

## 2014-04-19 DIAGNOSIS — R079 Chest pain, unspecified: Secondary | ICD-10-CM | POA: Diagnosis present

## 2014-04-19 DIAGNOSIS — E039 Hypothyroidism, unspecified: Secondary | ICD-10-CM | POA: Insufficient documentation

## 2014-04-19 DIAGNOSIS — Z85828 Personal history of other malignant neoplasm of skin: Secondary | ICD-10-CM | POA: Insufficient documentation

## 2014-04-19 DIAGNOSIS — Z79899 Other long term (current) drug therapy: Secondary | ICD-10-CM | POA: Insufficient documentation

## 2014-04-19 DIAGNOSIS — I471 Supraventricular tachycardia: Secondary | ICD-10-CM | POA: Diagnosis not present

## 2014-04-19 DIAGNOSIS — Z7982 Long term (current) use of aspirin: Secondary | ICD-10-CM | POA: Insufficient documentation

## 2014-04-19 LAB — CBC
HEMATOCRIT: 40.4 % (ref 36.0–46.0)
HEMOGLOBIN: 13.6 g/dL (ref 12.0–15.0)
MCH: 31.9 pg (ref 26.0–34.0)
MCHC: 33.7 g/dL (ref 30.0–36.0)
MCV: 94.6 fL (ref 78.0–100.0)
Platelets: 252 10*3/uL (ref 150–400)
RBC: 4.27 MIL/uL (ref 3.87–5.11)
RDW: 13.6 % (ref 11.5–15.5)
WBC: 8.3 10*3/uL (ref 4.0–10.5)

## 2014-04-19 LAB — BASIC METABOLIC PANEL
Anion gap: 13 (ref 5–15)
BUN: 28 mg/dL — AB (ref 6–23)
CO2: 26 meq/L (ref 19–32)
Calcium: 10 mg/dL (ref 8.4–10.5)
Chloride: 101 mEq/L (ref 96–112)
Creatinine, Ser: 0.86 mg/dL (ref 0.50–1.10)
GFR calc Af Amer: 74 mL/min — ABNORMAL LOW (ref >90)
GFR, EST NON AFRICAN AMERICAN: 64 mL/min — AB (ref >90)
GLUCOSE: 95 mg/dL (ref 70–99)
POTASSIUM: 4.6 meq/L (ref 3.7–5.3)
SODIUM: 140 meq/L (ref 137–147)

## 2014-04-19 LAB — I-STAT TROPONIN, ED: TROPONIN I, POC: 0.01 ng/mL (ref 0.00–0.08)

## 2014-04-19 MED ORDER — NAPROXEN 250 MG PO TABS
500.0000 mg | ORAL_TABLET | Freq: Once | ORAL | Status: AC
  Administered 2014-04-19: 500 mg via ORAL
  Filled 2014-04-19: qty 2

## 2014-04-19 NOTE — ED Notes (Signed)
C/o intermittent sharp "electrical type" pain to L side of chest with radiation to back and L arm since Monday.  Denies sob, nausea, and vomiting.

## 2014-04-19 NOTE — ED Provider Notes (Signed)
CSN: [id number]     Arrival date & time 04/19/14  1951 History   First MD Initiated Contact with Patient 04/19/14 2032     Chief Complaint  Patient presents with  . Chest Pain     (Consider location/radiation/quality/duration/timing/severity/associated sxs/prior Treatment) HPI Patient is a 79 year old female who presents with chest pain. She says she's been feeling a sharp, "electric" pain in her left upper chest for the past 4 days. She she reports that movement and moving her left arm make it worse. She denies any associated shortness of breath. It is not pleuritic. She has had no rash on her skin. She has never had similar pain before. She denies radiation of this pain. She has had no cough or shortness of breath. She currently has the pain and thinks that it is a 4 out of 10 in severity. Nothing seems to make it better, she has not tried any treatments for it. She has no history of coronary artery disease.   Past Medical History  Diagnosis Date  . SVT (supraventricular tachycardia)   . Hypothyroidism   . Osteoporosis   . Dyslipidemia   . Skin cancer    Past Surgical History  Procedure Laterality Date  . Bunionectomy    . Hammer toe repair     Family History  Problem Relation Age of Onset  . Stroke Mother 1  . Alcohol abuse Father 28   History  Substance Use Topics  . Smoking status: Never Smoker   . Smokeless tobacco: Not on file  . Alcohol Use: No   OB History    No data available     Review of Systems  Constitutional: Negative for fever.  Respiratory: Negative for cough, chest tightness, shortness of breath and wheezing.   Cardiovascular: Positive for chest pain. Negative for palpitations and leg swelling.  Neurological: Negative for numbness and headaches.  All other systems reviewed and are negative.     Allergies  Codeine  Home Medications   Prior to Admission medications   Medication Sig Start Date End Date Taking? Authorizing Provider  aspirin  81 MG tablet Take 160 mg by mouth daily.   Yes Historical Provider, MD  calcium carbonate (OS-CAL) 600 MG TABS Take 600 mg by mouth daily.   Yes Historical Provider, MD  cholecalciferol (VITAMIN D) 1000 UNITS tablet Take 1,000 Units by mouth daily.   Yes Historical Provider, MD  levothyroxine (SYNTHROID, LEVOTHROID) 100 MCG tablet Take 100 mcg by mouth daily.   Yes Historical Provider, MD  metoprolol succinate (TOPROL-XL) 25 MG 24 hr tablet Take 25 mg by mouth daily.   Yes Historical Provider, MD  Multiple Vitamins-Minerals (MULTIVITAMINS THER. W/MINERALS) TABS Take 1 tablet by mouth daily.   Yes Historical Provider, MD  VESICARE 5 MG tablet Take 5 mg by mouth daily.  08/08/12  Yes Historical Provider, MD   BP 133/74 mmHg  Pulse 80  Temp(Src) 98 F (36.7 C) (Oral)  Resp 26  SpO2 98% Physical Exam  Constitutional: She is oriented to person, place, and time. She appears well-developed and well-nourished. No distress.  HENT:  Head: Normocephalic and atraumatic.  Eyes: Pupils are equal, round, and reactive to light.  Cardiovascular: Normal rate and regular rhythm.   No murmur heard. Pulmonary/Chest: Effort normal and breath sounds normal. No respiratory distress.  Tenderness to palpation along the fourth rib, no crepitus noted deformity. No skin changes, no rash on the chest  Abdominal: Soft. Bowel sounds are normal. She exhibits no distension.  There is no tenderness.  Musculoskeletal: Normal range of motion. She exhibits no edema.  Neurological: She is alert and oriented to person, place, and time.  Skin: Skin is warm.  Psychiatric: She has a normal mood and affect.  Nursing note and vitals reviewed.   ED Course  Procedures (including critical care time) Labs Review Labs Reviewed  BASIC METABOLIC PANEL - Abnormal; Notable for the following:    BUN 28 (*)    GFR calc non Af Amer 64 (*)    GFR calc Af Amer 74 (*)    All other components within normal limits  CBC  I-STAT TROPOININ,  ED    Imaging Review Dg Chest 2 View  04/19/2014   CLINICAL DATA:  Intermittent left chest pain for 3 day is  EXAM: CHEST  2 VIEW  COMPARISON:  None.  FINDINGS: Mediastinal contour is normal. The heart size is mildly enlarged. The lungs are hyperinflated. There is no focal infiltrate, pulmonary edema, or pleural effusion. There is kyphosis of spine.  IMPRESSION: No active cardiopulmonary disease.  Emphysema.   Electronically Signed   By: Abelardo Diesel M.D.   On: 04/19/2014 21:50     EKG Interpretation   Date/Time:  Thursday April 19 2014 19:58:57 EST Ventricular Rate:  75 PR Interval:  150 QRS Duration: 94 QT Interval:  418 QTC Calculation: 466 R Axis:   26 Text Interpretation:  Normal sinus rhythm Incomplete right bundle branch  block Nonspecific ST abnormality No significant change since last tracing  Confirmed by Maryan Rued  MD, Loree Fee ([id number]) on 04/19/2014 10:15:42 PM      MDM   Final diagnoses:  Precordial pain    Otherwise healthy 79 year old female presenting with left-sided chest pain. She is in no distress, and has had these symptoms persistently for the past 4 days. Story is atypical, and patient has a paucity of risk factors for acute coronary syndrome. EKG unremarkable, no signs of acute ischemic disease. Troponin negative. She has some pain and tenderness to palpation of the left chest, and she says the pain is worsened with movement. ACS thought to be less likely, however still somewhat concerning for angina, and the patient would benefit from outpatient follow-up and workup. She says that she will see her cardiologist as soon as possible for follow-up.  Chest x-ray shows no signs of rib fracture, pneumonia or pneumothorax. She has equal pulses bilaterally, and does not radiate to the back, doubt dissection. She has no rash or vesicles suggestive of shingles.  Through sure decision-making, believe the patient is safe for discharge home, and she will follow-up with  her cardiologist as soon as possible.  Leata Mouse, MD 04/19/14 1761  Blanchie Dessert, MD 04/19/14 [phone]

## 2014-04-19 NOTE — Discharge Instructions (Signed)

## 2014-04-20 ENCOUNTER — Telehealth: Payer: Self-pay | Admitting: Cardiovascular Disease

## 2014-04-20 NOTE — Telephone Encounter (Signed)
I spoke with the pt and she called to schedule a follow-up appointment with Cardiology per ER visit. Appointment scheduled on 04/23/14 with Ermalinda Barrios PA.

## 2014-04-20 NOTE — Telephone Encounter (Signed)
New Msg   Pt calling, pain in left side of chest, went to ER last night nothing found. Pt not in pain now. Please call at [phone].

## 2014-04-23 ENCOUNTER — Ambulatory Visit (INDEPENDENT_AMBULATORY_CARE_PROVIDER_SITE_OTHER): Payer: Medicare Other | Admitting: Physician Assistant

## 2014-04-23 ENCOUNTER — Encounter: Payer: Self-pay | Admitting: Physician Assistant

## 2014-04-23 VITALS — BP 130/82 | HR 59 | Ht 63.75 in | Wt 134.0 lb

## 2014-04-23 DIAGNOSIS — R079 Chest pain, unspecified: Secondary | ICD-10-CM

## 2014-04-23 DIAGNOSIS — I471 Supraventricular tachycardia: Secondary | ICD-10-CM

## 2014-04-23 NOTE — Assessment & Plan Note (Signed)
Patient went to the emergency room with chest pain described as sharp shooting radiating into her left shoulder. This occurred after a lot of exertion with scrubbing floors changing beds, and putting up to Christmas trees. She has had no further chest pain since she started the Naprosyn. She still is a little tender in her chest to palpation. Suspect this is musculoskeletal chest pain. She only has hypertension as a risk factor for CAD. She has no history of hyperlipidemia, diabetes mellitus, family history or smoking history. She is very stressed with the holidays and I suspect this is contributing to her problems. Recommend slowing down, no heavy lifting. Offered a stress test for her peace of mind. The patient would like to hold off for now. She will call if she has any further symptoms.

## 2014-04-23 NOTE — Assessment & Plan Note (Signed)
Patient has had 2 episodes of dizziness in the past month. This was her symptom when she has SVT in the past. Suspect she may have had an arrhythmia. I asked her to take her pulse is time she has some dizziness. She can take an extra metoprolol if needed. She will call she has any further symptoms. Follow-up with Dr. Burt Knack in one to 2 months.

## 2014-04-23 NOTE — Patient Instructions (Signed)
Your physician recommends that you continue on your current medications as directed. Please refer to the Current Medication list given to you today.    Your physician recommends that you schedule a follow-up appointment in:  WITH DR COOPER IN 1 TO 2 MONTHS

## 2014-04-23 NOTE — Progress Notes (Signed)
HPI: This is a 79 year old female patient Dr. Burt Knack that has a history of SVT converting with IV adenosine in 2013. She has been maintained on long acting metoprolol since that time. She was last seen by Dr. Burt Knack in April 2015 which time she was doing well.  She was seen in the emergency room on 04/19/14 with atypical left-sided chest pain that had been intermittent for 4 days. It would last up to 6 hours and would occur with certain movements of her arm. Nitroglycerin did not help. EKG showed no significant change and troponin was negative. She was discharged home to follow-up.  Last 2-D echo in 2013 showed normal LV size and function EF 60%.  Patient comes in today after recent emergency room visit. She was given Naprosyn for musculoskeletal pain and it seems to have helped. She states she was very stressed out with the holidays and had 15 people over to her house for Thanksgiving. She described her floors and cleaned her whole house as well as changing bed linens. She then put up to Christmas trees. Since her emergency room visit she still has soreness if she touches her left chest area but has not had the sharp shooting pains up into her shoulder. She also had stabbing sensations that would come and go. She had no exertional symptoms. She hasn't had any chest tightness, pressure, dyspnea, dyspnea on exertion. She did have 2 episodes of dizziness both occurring at rest once while riding in a car. It was fleeting and she didn't feel any palpitations but says she didn't feel it when she had her SVT. She just had dizziness then.  Allergies  Allergen Reactions  . Codeine Nausea And Vomiting     Current Outpatient Prescriptions  Medication Sig Dispense Refill  . aspirin 81 MG tablet Take 160 mg by mouth daily.    . calcium carbonate (OS-CAL) 600 MG TABS Take 600 mg by mouth daily.    . cholecalciferol (VITAMIN D) 1000 UNITS tablet Take 1,000 Units by mouth daily.    Marland Kitchen  levothyroxine (SYNTHROID, LEVOTHROID) 100 MCG tablet Take 100 mcg by mouth daily.    . metoprolol succinate (TOPROL-XL) 25 MG 24 hr tablet Take 25 mg by mouth daily.    . Multiple Vitamins-Minerals (MULTIVITAMINS THER. W/MINERALS) TABS Take 1 tablet by mouth daily.    . VESICARE 5 MG tablet Take 5 mg by mouth daily.      No current facility-administered medications for this visit.    Past Medical History  Diagnosis Date  . SVT (supraventricular tachycardia)   . Hypothyroidism   . Osteoporosis   . Dyslipidemia   . Skin cancer     Past Surgical History  Procedure Laterality Date  . Bunionectomy    . Hammer toe repair      Family History  Problem Relation Age of Onset  . Stroke Mother 47  . Alcohol abuse Father 90    History   Social History  . Marital Status: Married    Spouse Name: N/A    Number of Children: N/A  . Years of Education: N/A   Occupational History  . Works in the [employer] area at [employer]  . Smoking status: Never Smoker   . Smokeless tobacco: Not on file  . Alcohol Use: No  . Drug Use: No  . Sexual Activity: Not on file   Other Topics Concern  . Not on file  Social History Narrative   Lives alone.  Retired Network engineer for [employer].    ROS: See history of present illness otherwise negative  BP 130/82 mmHg  Pulse 59  Ht 5' 3.75" (1.619 m)  Wt 134 lb (60.782 kg)  BMI 23.19 kg/m2  PHYSICAL EXAM: Well-nournished, in no acute distress. Neck: No JVD, HJR, Bruit, or thyroid enlargement  Lungs: No tachypnea, clear without wheezing, rales, or rhonchi  Cardiovascular: Left chest mildly tender to palpation, RRR, PMI not displaced, Normal S1 and S2, no murmurs, gallops, bruit, thrill, or heave.  Abdomen: BS normal. Soft without organomegaly, masses, lesions or tenderness.  Extremities: without cyanosis, clubbing or edema. Good distal pulses bilateral  SKin: Warm, no lesions or rashes   Musculoskeletal: No  deformities  Neuro: no focal signs   Wt Readings from Last 3 Encounters:  08/21/13 139 lb (63.05 kg)  08/10/12 142 lb (64.411 kg)  08/11/11 148 lb 12.8 oz (67.495 kg)    Lab Results  Component Value Date   WBC 8.3 04/19/2014   HGB 13.6 04/19/2014   HCT 40.4 04/19/2014   PLT 252 04/19/2014   GLUCOSE 95 04/19/2014   NA 140 04/19/2014   K 4.6 04/19/2014   CL 101 04/19/2014   CREATININE 0.86 04/19/2014   BUN 28* 04/19/2014   CO2 26 04/19/2014    EKG: Sinus bradycardia 59 bpm with incomplete right bundle branch block, no acute change  2-D echo 07/02/11 Study Conclusions  - Left ventricle: The cavity size was normal. Wall thickness   was normal. The estimated ejection fraction was 60%. Wall   motion was normal; there were no regional wall motion   abnormalities. - Mitral valve: Calcified annulus. No significant   regurgitation. - Right ventricle: The cavity size was mildly dilated. Transthoracic echocardiography

## 2014-06-07 ENCOUNTER — Encounter: Payer: Self-pay | Admitting: Cardiovascular Disease

## 2014-06-07 ENCOUNTER — Ambulatory Visit (INDEPENDENT_AMBULATORY_CARE_PROVIDER_SITE_OTHER): Payer: Medicare Other | Admitting: Cardiovascular Disease

## 2014-06-07 VITALS — BP 116/70 | HR 66 | Ht 63.75 in | Wt 136.1 lb

## 2014-06-07 DIAGNOSIS — I471 Supraventricular tachycardia: Secondary | ICD-10-CM

## 2014-06-07 NOTE — Progress Notes (Signed)
Cardiology Office Note   Date:  06/07/2014   ID:  Lori Krueger, DOB [birth year], MRN [id number]  PCP:  Haywood Pao, MD  Cardiologist:  Sherren Mocha, MD    Chief Complaint  Patient presents with  . Follow-up     History of Present Illness: Lori Krueger is a 80 y.o. female who presents for follow-up evaluation. She has a history of supraventricular tachycardia. She had an episode in January 2013 when she was treated with IV adenosine and successfully converted back to sinus rhythm. She was started on long-acting metoprolol at that time.  The patient was evaluated in the ED in December with atypical chest pain. EKG and labs were normal. This occurred a few days after scrubbing her kitchen floor and was thought to be musculoskeletal in origin. She has had no recurrence of symptoms. She denies exertional chest pain or pressure. She denies edema, orthopnea, or PND. She did have an episode of lightheadedness last week but this occurred after she skipped breakfast.   Past Medical History  Diagnosis Date  . SVT (supraventricular tachycardia)   . Hypothyroidism   . Osteoporosis   . Dyslipidemia   . Skin cancer     Past Surgical History  Procedure Laterality Date  . Bunionectomy    . Hammer toe repair      Current Outpatient Prescriptions  Medication Sig Dispense Refill  . aspirin 81 MG tablet Take 160 mg by mouth daily.    . calcium carbonate (OS-CAL) 600 MG TABS Take 600 mg by mouth daily.    . cholecalciferol (VITAMIN D) 1000 UNITS tablet Take 1,000 Units by mouth daily.    Marland Kitchen levothyroxine (SYNTHROID, LEVOTHROID) 100 MCG tablet Take 100 mcg by mouth daily.    . metoprolol succinate (TOPROL-XL) 25 MG 24 hr tablet Take 25 mg by mouth daily.    . Multiple Vitamins-Minerals (MULTIVITAMINS THER. W/MINERALS) TABS Take 1 tablet by mouth daily.     No current facility-administered medications for this visit.    Allergies:   Codeine   Social History:  The patient  reports  that she has never smoked. She does not have any smokeless tobacco history on file. She reports that she does not drink alcohol or use illicit drugs.   Family History:  The patient's  family history includes Alcohol abuse (age of onset: 41) in her father; Stroke (age of onset: 46) in her mother.   PHYSICAL EXAM: VS:  BP 116/70 mmHg  Pulse 66  Ht 5' 3.75" (1.619 m)  Wt 136 lb 1.9 oz (61.744 kg)  BMI 23.56 kg/m2 , BMI Body mass index is 23.56 kg/(m^2). GEN: Well nourished, well developed, in no acute distress HEENT: normal Neck: no JVD, carotid bruits, or masses Cardiac: RRR without murmur or gallop                No peripheral edema Respiratory:  clear to auscultation bilaterally, normal work of breathing GI: soft, nontender, nondistended, + BS MS: no deformity or atrophy Skin: warm and dry, no rash Neuro:  Strength and sensation are intact Psych: euthymic mood, full affect  EKG:  EKG is not ordered today.  Recent Labs: 04/19/2014: BUN 28*; Creatinine 0.86; Hemoglobin 13.6; Platelets 252; Potassium 4.6; Sodium 140   Lipid Panel  No results found for: CHOL, TRIG, HDL, CHOLHDL, VLDL, LDLCALC, LDLDIRECT    Wt Readings from Last 3 Encounters:  06/07/14 136 lb 1.9 oz (61.744 kg)  04/23/14 134 lb (60.782 kg)  08/21/13 139 lb (  63.05 kg)     Cardiac Studies Reviewed: 2-D echocardiogram 07/02/2011: Study Conclusions  - Left ventricle: The cavity size was normal. Wall thickness was normal. The estimated ejection fraction was 60%. Wall motion was normal; there were no regional wall motion abnormalities. - Mitral valve: Calcified annulus. No significant regurgitation. - Right ventricle: The cavity size was mildly dilated.  ASSESSMENT AND PLAN: Paroxysmal SVT: the patient is doing well without palpitations. Continue her beta-blocker and she will call if any problems. No evidence of any structural heart disease.  Chest pain: suspect musculoskeletal. No further workup  indicated at this point as she is asymptomatic.  Current medicines are reviewed with the patient today.  The patient does not have concerns regarding medicines.  The following changes have been made:  no change  Labs/ tests ordered today include: none  No orders of the defined types were placed in this encounter.   Disposition:   FU with me in 12 months  Signed, Sherren Mocha, MD  06/07/2014 9:59 AM    [facility] [place], [place], [place]  [address] Phone: [phone]; Fax: [phone]

## 2014-06-07 NOTE — Patient Instructions (Signed)
Your physician recommends that you continue on your current medications as directed. Please refer to the Current Medication list given to you today.  Your physician wants you to follow-up in: 1 YEAR with Dr Cooper.  You will receive a reminder letter in the mail two months in advance. If you don't receive a letter, please call our office to schedule the follow-up appointment.  

## 2014-12-06 ENCOUNTER — Other Ambulatory Visit (HOSPITAL_COMMUNITY): Payer: Self-pay | Admitting: *Deleted

## 2014-12-07 ENCOUNTER — Encounter (HOSPITAL_COMMUNITY)
Admission: RE | Admit: 2014-12-07 | Discharge: 2014-12-07 | Disposition: A | Discharge status: Home / Self Care | Payer: Medicare Other | Source: Ambulatory Visit | Attending: Internal Medicine | Admitting: Internal Medicine

## 2014-12-07 DIAGNOSIS — M810 Age-related osteoporosis without current pathological fracture: Secondary | ICD-10-CM | POA: Insufficient documentation

## 2014-12-07 MED ORDER — DENOSUMAB 60 MG/ML ~~LOC~~ SOLN
60.0000 mg | Freq: Once | SUBCUTANEOUS | Status: AC
  Administered 2014-12-07: 60 mg via SUBCUTANEOUS
  Filled 2014-12-07: qty 1

## 2015-01-22 ENCOUNTER — Ambulatory Visit (HOSPITAL_COMMUNITY): Admission: RE | Admit: 2015-01-22 | Payer: Medicare Other | Source: Ambulatory Visit

## 2015-09-29 NOTE — Progress Notes (Signed)
Cardiology Office Note Date:  09/30/2015   ID:  Lori Krueger, DOB 1934-07-04, MRN [id number]  PCP:  Haywood Pao, MD  Cardiologist:  Sherren Mocha, MD    Chief Complaint  Patient presents with  . proxysmal SVT    History of Present Illness: Lori Krueger is a 81 y.o. female who presents for follow-up evaluation. She has a history of supraventricular tachycardia. She had an episode in 2013 when she was treated with IV adenosine and successfully converted back to sinus rhythm. She was started on long-acting metoprolol at that time. An echo at that time showed normal LV function and no evidence of valvular disease.   The patient is doing well. 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 She has no symptoms of chest pain, chest pressure, or shortness of breath. She has rare palpitations associated with stress. Overall feels she is doing well.   Past Medical History  Diagnosis Date  . SVT (supraventricular tachycardia) ([facility])   . Hypothyroidism   . Osteoporosis   . Dyslipidemia   . Skin cancer     Past Surgical History  Procedure Laterality Date  . Bunionectomy    . Hammer toe repair      Current Outpatient Prescriptions  Medication Sig Dispense Refill  . aspirin 81 MG tablet Take 160 mg by mouth daily.    . calcium carbonate (OS-CAL) 600 MG TABS Take 600 mg by mouth daily.    . cholecalciferol (VITAMIN D) 1000 UNITS tablet Take 1,000 Units by mouth daily.    Marland Kitchen levothyroxine (SYNTHROID, LEVOTHROID) 100 MCG tablet Take 100 mcg by mouth daily.    . metoprolol succinate (TOPROL-XL) 25 MG 24 hr tablet Take 25 mg by mouth daily.    . Multiple Vitamins-Minerals (MULTIVITAMINS THER. W/MINERALS) TABS Take 1 tablet by mouth daily.    . VESICARE 5 MG tablet Take 1 tablet by mouth daily.     No current facility-administered medications for this visit.    Allergies:   Codeine   Social History:  The patient  reports that she has never smoked. She does not have any  smokeless tobacco history on file. She reports that she does not drink alcohol or use illicit drugs.   Family History:  The patient's  family history includes Alcohol abuse (age of onset: 55) in her father; Stroke (age of onset: 66) in her mother.    ROS:  Please see the history of present illness.  All other systems are reviewed and negative.    PHYSICAL EXAM: VS:  BP 126/76 mmHg  Pulse 66  Ht 5\' 3"  (1.6 m)  Wt 140 lb (63.504 kg)  BMI 24.81 kg/m2 , BMI Body mass index is 24.81 kg/(m^2). GEN: Well nourished, well developed, in no acute distress HEENT: normal Neck: no JVD, no masses. No carotid bruits Cardiac: RRR without murmur or gallop                Respiratory:  clear to auscultation bilaterally, normal work of breathing GI: soft, nontender, nondistended, + BS MS: no deformity or atrophy Ext: no pretibial edema, pedal pulses 2+= bilaterally Skin: warm and dry, no rash Neuro:  Strength and sensation are intact Psych: euthymic mood, full affect  EKG:  EKG is ordered today. The ekg ordered today shows NSR 69 bpm, within normal limits  Recent Labs: No results found for requested labs within last 365 days.   Lipid Panel  No results found for: CHOL, TRIG, HDL, CHOLHDL, VLDL,  [place], LDLDIRECT    Wt Readings from Last 3 Encounters:  09/30/15 140 lb (63.504 kg)  06/07/14 136 lb 1.9 oz (61.744 kg)  04/23/14 134 lb (60.782 kg)    ASSESSMENT AND PLAN: Palpitations: minimal sx's at present - tolerating metoprolol succinate. Continue same Rx. LV function normal by past echo.   Current medicines are reviewed with the patient today.  The patient does not have concerns regarding medicines.  Labs/ tests ordered today include:   Orders Placed This Encounter  Procedures  . EKG 12-Lead   Disposition:   FU one year  Signed, Sherren Mocha, MD  09/30/2015 1:06 PM    [facility] [place], [place], [place]  [address] Phone: [phone]; Fax: [phone]

## 2015-09-30 ENCOUNTER — Ambulatory Visit (INDEPENDENT_AMBULATORY_CARE_PROVIDER_SITE_OTHER): Payer: Medicare Other | Admitting: Cardiovascular Disease

## 2015-09-30 ENCOUNTER — Encounter: Payer: Self-pay | Admitting: Cardiovascular Disease

## 2015-09-30 VITALS — BP 126/76 | HR 66 | Ht 63.0 in | Wt 140.0 lb

## 2015-09-30 DIAGNOSIS — I471 Supraventricular tachycardia: Secondary | ICD-10-CM | POA: Diagnosis not present

## 2015-09-30 NOTE — Patient Instructions (Signed)

## 2016-08-16 ENCOUNTER — Encounter (HOSPITAL_COMMUNITY): Payer: Self-pay

## 2016-08-16 ENCOUNTER — Emergency Department (HOSPITAL_COMMUNITY): Payer: Medicare Other

## 2016-08-16 ENCOUNTER — Emergency Department (HOSPITAL_COMMUNITY)
Admission: EM | Admit: 2016-08-16 | Discharge: 2016-08-16 | Disposition: A | Discharge status: Home / Self Care | Payer: Medicare Other | Attending: Emergency Medicine | Admitting: Emergency Medicine

## 2016-08-16 DIAGNOSIS — S0083XA Contusion of other part of head, initial encounter: Secondary | ICD-10-CM

## 2016-08-16 DIAGNOSIS — Y999 Unspecified external cause status: Secondary | ICD-10-CM | POA: Insufficient documentation

## 2016-08-16 DIAGNOSIS — I10 Essential (primary) hypertension: Secondary | ICD-10-CM | POA: Diagnosis not present

## 2016-08-16 DIAGNOSIS — S0993XA Unspecified injury of face, initial encounter: Secondary | ICD-10-CM | POA: Diagnosis present

## 2016-08-16 DIAGNOSIS — W228XXA Striking against or struck by other objects, initial encounter: Secondary | ICD-10-CM | POA: Diagnosis not present

## 2016-08-16 DIAGNOSIS — E039 Hypothyroidism, unspecified: Secondary | ICD-10-CM | POA: Insufficient documentation

## 2016-08-16 DIAGNOSIS — Z7982 Long term (current) use of aspirin: Secondary | ICD-10-CM | POA: Diagnosis not present

## 2016-08-16 DIAGNOSIS — W19XXXA Unspecified fall, initial encounter: Secondary | ICD-10-CM

## 2016-08-16 DIAGNOSIS — S0990XA Unspecified injury of head, initial encounter: Secondary | ICD-10-CM | POA: Diagnosis not present

## 2016-08-16 DIAGNOSIS — Y92810 Car as the place of occurrence of the external cause: Secondary | ICD-10-CM | POA: Diagnosis not present

## 2016-08-16 DIAGNOSIS — Y9389 Activity, other specified: Secondary | ICD-10-CM | POA: Insufficient documentation

## 2016-08-16 DIAGNOSIS — Z79899 Other long term (current) drug therapy: Secondary | ICD-10-CM | POA: Diagnosis not present

## 2016-08-16 DIAGNOSIS — Z85828 Personal history of other malignant neoplasm of skin: Secondary | ICD-10-CM | POA: Insufficient documentation
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
## 2016-08-16 NOTE — Discharge Instructions (Signed)
Return to the ED with any concerns including vomiting, seizure activity, worsening headache, weaknes of arms or leg, decreased level of alertness/lethargy, or any other alarming symptoms

## 2016-09-17 ENCOUNTER — Other Ambulatory Visit: Payer: Self-pay | Admitting: Internal Medicine

## 2016-09-17 DIAGNOSIS — R1030 Lower abdominal pain, unspecified: Secondary | ICD-10-CM

## 2016-09-21 ENCOUNTER — Ambulatory Visit
Admission: RE | Admit: 2016-09-21 | Discharge: 2016-09-21 | Disposition: A | Discharge status: Home / Self Care | Payer: Medicare Other | Source: Ambulatory Visit | Attending: Internal Medicine | Admitting: Internal Medicine

## 2016-09-21 DIAGNOSIS — R1030 Lower abdominal pain, unspecified: Secondary | ICD-10-CM

## 2016-09-21 MED ORDER — IOPAMIDOL (ISOVUE-300) INJECTION 61%
100.0000 mL | Freq: Once | INTRAVENOUS | Status: AC | PRN
  Administered 2016-09-21: 100 mL via INTRAVENOUS

## 2016-10-07 ENCOUNTER — Encounter: Payer: Self-pay | Admitting: Physician Assistant

## 2016-10-07 ENCOUNTER — Ambulatory Visit (INDEPENDENT_AMBULATORY_CARE_PROVIDER_SITE_OTHER): Payer: Medicare Other | Admitting: Physician Assistant

## 2016-10-07 VITALS — BP 132/80 | HR 75 | Ht 63.0 in | Wt 142.1 lb

## 2016-10-07 DIAGNOSIS — I471 Supraventricular tachycardia: Secondary | ICD-10-CM

## 2016-10-07 NOTE — Patient Instructions (Addendum)
Medication Instructions:  Stay on Baby Aspirin 81 mg once a day.  Labwork: None   Testing/Procedures: None   Follow-Up: Dr. Sherren Mocha or Richardson Dopp, PA-C in 2 years.   Any Other Special Instructions Will Be Listed Below (If Applicable).  If you need a refill on your cardiac medications before your next appointment, please call your pharmacy.

## 2016-10-07 NOTE — Progress Notes (Signed)
Cardiology Office Note:    Date:  10/07/2016   ID:  Lori Krueger, DOB [birth year], MRN [id number]  PCP:  Haywood Pao, MD  Cardiologist:  Dr. Sherren Mocha    Referring MD: Haywood Pao, MD   Chief Complaint  Patient presents with  . Follow-up    SVT    History of Present Illness:    Lori Krueger is a 82 y.o. female with a hx of SVT, HL, hypothyroidism. She had an episode of supraventricular tachycardia in 2013 treated with IV adenosine. Echocardiogram at that time was normal. She has remained on beta blocker therapy. Last seen by Dr. Burt Knack 09/2015.    Ms. Lori Krueger returns for Cardiology follow up.  She is here alone.  She has an occasional skipping sensation.  However, she denies chest pain, shortness of breath, syncope, orthopnea, PND, edema.  She denies rapid palpitations.    Prior CV studies:   The following studies were reviewed today:  Echo 06/2011 EF 60, normal wall motion, MAC, mild RVE  Past Medical History:  Diagnosis Date  . Dyslipidemia   . Hypothyroidism   . Osteoporosis   . Skin cancer   . SVT (supraventricular tachycardia) ([facility])     Past Surgical History:  Procedure Laterality Date  . BUNIONECTOMY    . Hammer toe repair      Current Medications: Current Meds  Medication Sig  . aspirin EC 81 MG tablet Take 81 mg by mouth daily.  . calcium carbonate (OS-CAL) 600 MG TABS Take 600 mg by mouth daily.  . cholecalciferol (VITAMIN D) 1000 UNITS tablet Take 1,000 Units by mouth daily.  Marland Kitchen levothyroxine (SYNTHROID, LEVOTHROID) 100 MCG tablet Take 100 mcg by mouth daily.  . metoprolol succinate (TOPROL-XL) 25 MG 24 hr tablet Take 25 mg by mouth daily.  . Multiple Vitamins-Minerals (MULTIVITAMINS THER. W/MINERALS) TABS Take 1 tablet by mouth daily.  . VESICARE 5 MG tablet Take 1 tablet by mouth daily.     Allergies:   Codeine   Social History   Social History  . Marital status: Widowed    Spouse name: N/A  . Number of children: N/A  . Years of  education: N/A   Occupational History  . Works in the [employer] area at [employer]  . Smoking status: Never Smoker  . Smokeless tobacco: Never Used  . Alcohol use No  . Drug use: No  . Sexual activity: Not Asked   Other Topics Concern  . None   Social History Narrative   Lives alone.  Retired Network engineer for [employer].     Family Hx: The patient's family history includes Alcohol abuse (age of onset: 73) in her father; Stroke (age of onset: 76) in her mother.  ROS:   Please see the history of present illness.    ROS All other systems reviewed and are negative.   EKGs/Labs/Other Test Reviewed:    EKG:  EKG is  ordered today.  The ekg ordered today demonstrates NSR, HR 75, normal axis, RSR' in V1, QTc 462 ms, no significant changes  Recent Labs: No results found for requested labs within last 8760 hours.   Recent Lipid Panel No results found for: CHOL, TRIG, HDL, CHOLHDL, VLDL, LDLCALC, LDLDIRECT   Physical Exam:    VS:  BP 132/80   Pulse 75   Ht _0  (1.6 m)   Wt 142 lb 1.9 oz (64.5 kg)   BMI 25.18 kg/m  Wt Readings from Last 3 Encounters:  10/07/16 142 lb 1.9 oz (64.5 kg)  09/30/15 140 lb (63.5 kg)  06/07/14 136 lb 1.9 oz (61.7 kg)     Physical Exam  Constitutional: She is oriented to person, place, and time. She appears well-developed and well-nourished. No distress.  HENT:  Head: Normocephalic and atraumatic.  Eyes: No scleral icterus.  Neck: Normal range of motion. No JVD present. Carotid bruit is not present.  Cardiovascular: Normal rate, regular rhythm, S1 normal, S2 normal and normal heart sounds.   No murmur heard. Pulmonary/Chest: Breath sounds normal. She has no wheezes. She has no rhonchi. She has no rales.  Abdominal: Soft. There is no tenderness.  Musculoskeletal: She exhibits no edema.  Neurological: She is alert and oriented to person, place, and time.  Skin: Skin is warm and dry.  Psychiatric: She has a normal  mood and affect.    ASSESSMENT:    1. SVT (supraventricular tachycardia) ([facility])    PLAN:    In order of problems listed above:  1. SVT (supraventricular tachycardia) ([facility]) -  She had a single episode of SVT responsive to adenosine in 2013 without apparent recurrence.  She remains stable on beta-blocker.  ECG is unchanged.  Continue current therapy.  As she has been stable for several years and has regular follow up with her PCP, we can follow up with her in 2 years or sooner if needed.    Dispo:  Return in about 2 years (around 10/08/2018) for Routine Follow Up, w/ Dr. Burt Knack or Richardson Dopp, PA-C.   Medication Adjustments/Labs and Tests Ordered: Current medicines are reviewed at length with the patient today.  Concerns regarding medicines are outlined above.  Orders/Tests:  Orders Placed This Encounter  Procedures  . EKG 12-Lead   Medication changes: No orders of the defined types were placed in this encounter.  Signed, Richardson Dopp, PA-C  10/07/2016 4:30 PM    [facility] [place], [place], [place]  [address] Phone: [phone]; Fax: [phone]

## 2017-05-16 ENCOUNTER — Encounter (HOSPITAL_COMMUNITY): Payer: Self-pay | Admitting: Emergency Medicine

## 2017-05-16 ENCOUNTER — Emergency Department (HOSPITAL_COMMUNITY): Payer: Medicare Other

## 2017-05-16 ENCOUNTER — Inpatient Hospital Stay (HOSPITAL_COMMUNITY)
Admission: EM | Admit: 2017-05-16 | Discharge: 2017-05-20 | DRG: 483 | Disposition: A | Discharge status: Skilled Nursing Facility | Payer: Medicare Other | Attending: Family Medicine | Admitting: Family Medicine

## 2017-05-16 DIAGNOSIS — Z7982 Long term (current) use of aspirin: Secondary | ICD-10-CM

## 2017-05-16 DIAGNOSIS — Z79890 Hormone replacement therapy (postmenopausal): Secondary | ICD-10-CM

## 2017-05-16 DIAGNOSIS — S42291A Other displaced fracture of upper end of right humerus, initial encounter for closed fracture: Secondary | ICD-10-CM | POA: Diagnosis not present

## 2017-05-16 DIAGNOSIS — Z91011 Allergy to milk products: Secondary | ICD-10-CM

## 2017-05-16 DIAGNOSIS — M810 Age-related osteoporosis without current pathological fracture: Secondary | ICD-10-CM | POA: Diagnosis present

## 2017-05-16 DIAGNOSIS — Z823 Family history of stroke: Secondary | ICD-10-CM

## 2017-05-16 DIAGNOSIS — M6588 Other synovitis and tenosynovitis, other site: Secondary | ICD-10-CM | POA: Diagnosis present

## 2017-05-16 DIAGNOSIS — Z885 Allergy status to narcotic agent status: Secondary | ICD-10-CM

## 2017-05-16 DIAGNOSIS — I10 Essential (primary) hypertension: Secondary | ICD-10-CM | POA: Diagnosis not present

## 2017-05-16 DIAGNOSIS — T4275XA Adverse effect of unspecified antiepileptic and sedative-hypnotic drugs, initial encounter: Secondary | ICD-10-CM | POA: Diagnosis not present

## 2017-05-16 DIAGNOSIS — Z85828 Personal history of other malignant neoplasm of skin: Secondary | ICD-10-CM

## 2017-05-16 DIAGNOSIS — R0902 Hypoxemia: Secondary | ICD-10-CM | POA: Diagnosis present

## 2017-05-16 DIAGNOSIS — S42201A Unspecified fracture of upper end of right humerus, initial encounter for closed fracture: Secondary | ICD-10-CM

## 2017-05-16 DIAGNOSIS — S43034A Inferior dislocation of right humerus, initial encounter: Secondary | ICD-10-CM | POA: Diagnosis present

## 2017-05-16 DIAGNOSIS — E039 Hypothyroidism, unspecified: Secondary | ICD-10-CM | POA: Diagnosis present

## 2017-05-16 DIAGNOSIS — Z79899 Other long term (current) drug therapy: Secondary | ICD-10-CM

## 2017-05-16 DIAGNOSIS — J181 Lobar pneumonia, unspecified organism: Secondary | ICD-10-CM

## 2017-05-16 DIAGNOSIS — Z09 Encounter for follow-up examination after completed treatment for conditions other than malignant neoplasm: Secondary | ICD-10-CM

## 2017-05-16 DIAGNOSIS — J189 Pneumonia, unspecified organism: Secondary | ICD-10-CM

## 2017-05-16 DIAGNOSIS — S0181XA Laceration without foreign body of other part of head, initial encounter: Secondary | ICD-10-CM | POA: Diagnosis present

## 2017-05-16 DIAGNOSIS — E785 Hyperlipidemia, unspecified: Secondary | ICD-10-CM | POA: Diagnosis present

## 2017-05-16 DIAGNOSIS — J069 Acute upper respiratory infection, unspecified: Secondary | ICD-10-CM | POA: Diagnosis present

## 2017-05-16 DIAGNOSIS — Y9222 Religious institution as the place of occurrence of the external cause: Secondary | ICD-10-CM

## 2017-05-16 DIAGNOSIS — W010XXA Fall on same level from slipping, tripping and stumbling without subsequent striking against object, initial encounter: Secondary | ICD-10-CM | POA: Diagnosis present

## 2017-05-16 DIAGNOSIS — Z811 Family history of alcohol abuse and dependence: Secondary | ICD-10-CM

## 2017-05-16 DIAGNOSIS — Z23 Encounter for immunization: Secondary | ICD-10-CM

## 2017-05-16 DIAGNOSIS — Z419 Encounter for procedure for purposes other than remedying health state, unspecified: Secondary | ICD-10-CM

## 2017-05-16 DIAGNOSIS — Y92230 Patient room in hospital as the place of occurrence of the external cause: Secondary | ICD-10-CM | POA: Diagnosis not present

## 2017-05-16 DIAGNOSIS — I471 Supraventricular tachycardia: Secondary | ICD-10-CM | POA: Diagnosis present

## 2017-05-16 LAB — CBC WITH DIFFERENTIAL/PLATELET
Basophils Absolute: 0 10*3/uL (ref 0.0–0.1)
Basophils Relative: 0 %
EOS ABS: 0 10*3/uL (ref 0.0–0.7)
EOS PCT: 0 %
HCT: 37.2 % (ref 36.0–46.0)
Hemoglobin: 12.1 g/dL (ref 12.0–15.0)
Lymphocytes Relative: 5 %
Lymphs Abs: 0.7 10*3/uL (ref 0.7–4.0)
MCH: 31.7 pg (ref 26.0–34.0)
MCHC: 32.5 g/dL (ref 30.0–36.0)
MCV: 97.4 fL (ref 78.0–100.0)
MONO ABS: 0.3 10*3/uL (ref 0.1–1.0)
MONOS PCT: 2 %
NEUTROS PCT: 93 %
Neutro Abs: 13.6 10*3/uL — ABNORMAL HIGH (ref 1.7–7.7)
PLATELETS: 294 10*3/uL (ref 150–400)
RBC: 3.82 MIL/uL — ABNORMAL LOW (ref 3.87–5.11)
RDW: 13.4 % (ref 11.5–15.5)
WBC: 14.7 10*3/uL — ABNORMAL HIGH (ref 4.0–10.5)

## 2017-05-16 LAB — BASIC METABOLIC PANEL
Anion gap: 11 (ref 5–15)
BUN: 17 mg/dL (ref 6–20)
CALCIUM: 8.8 mg/dL — AB (ref 8.9–10.3)
CO2: 25 mmol/L (ref 22–32)
CREATININE: 0.64 mg/dL (ref 0.44–1.00)
Chloride: 99 mmol/L — ABNORMAL LOW (ref 101–111)
GFR calc non Af Amer: >60 mL/min (ref >60)
Glucose, Bld: 145 mg/dL — ABNORMAL HIGH (ref 65–99)
Potassium: 4.5 mmol/L (ref 3.5–5.1)
SODIUM: 135 mmol/L (ref 135–145)

## 2017-05-16 LAB — I-STAT CHEM 8, ED
BUN: 19 mg/dL (ref 6–20)
CALCIUM ION: 1.14 mmol/L — AB (ref 1.15–1.40)
CREATININE: 0.7 mg/dL (ref 0.44–1.00)
Chloride: 100 mmol/L — ABNORMAL LOW (ref 101–111)
GLUCOSE: 164 mg/dL — AB (ref 65–99)
HCT: 38 % (ref 36.0–46.0)
Hemoglobin: 12.9 g/dL (ref 12.0–15.0)
Potassium: 4 mmol/L (ref 3.5–5.1)
Sodium: 140 mmol/L (ref 135–145)
TCO2: 29 mmol/L (ref 22–32)

## 2017-05-16 LAB — CBG MONITORING, ED: Glucose-Capillary: 147 mg/dL — ABNORMAL HIGH (ref 65–99)

## 2017-05-16 MED ORDER — SODIUM CHLORIDE 0.9 % IV SOLN
250.0000 mL | INTRAVENOUS | Status: DC | PRN
  Administered 2017-05-19: 250 mL via INTRAVENOUS

## 2017-05-16 MED ORDER — METOPROLOL SUCCINATE ER 25 MG PO TB24
25.0000 mg | ORAL_TABLET | Freq: Every day | ORAL | Status: DC
  Administered 2017-05-16 – 2017-05-20 (×5): 25 mg via ORAL
  Filled 2017-05-16 (×5): qty 1

## 2017-05-16 MED ORDER — LEVOTHYROXINE SODIUM 100 MCG PO TABS
100.0000 ug | ORAL_TABLET | ORAL | Status: DC
  Administered 2017-05-17 – 2017-05-20 (×4): 100 ug via ORAL
  Filled 2017-05-16 (×5): qty 1

## 2017-05-16 MED ORDER — PROPOFOL 10 MG/ML IV BOLUS
0.5000 mg/kg | Freq: Once | INTRAVENOUS | Status: DC
  Filled 2017-05-16: qty 20

## 2017-05-16 MED ORDER — OXYCODONE-ACETAMINOPHEN 5-325 MG PO TABS
1.0000 | ORAL_TABLET | Freq: Once | ORAL | Status: AC
  Administered 2017-05-16: 1 via ORAL
  Filled 2017-05-16: qty 1

## 2017-05-16 MED ORDER — SODIUM CHLORIDE 0.9% FLUSH
3.0000 mL | Freq: Two times a day (BID) | INTRAVENOUS | Status: DC
  Administered 2017-05-17 – 2017-05-20 (×7): 3 mL via INTRAVENOUS

## 2017-05-16 MED ORDER — TETANUS-DIPHTH-ACELL PERTUSSIS 5-2.5-18.5 LF-MCG/0.5 IM SUSP
0.5000 mL | Freq: Once | INTRAMUSCULAR | Status: AC
  Administered 2017-05-16: 0.5 mL via INTRAMUSCULAR
  Filled 2017-05-16: qty 0.5

## 2017-05-16 MED ORDER — FENTANYL CITRATE (PF) 100 MCG/2ML IJ SOLN
50.0000 ug | Freq: Once | INTRAMUSCULAR | Status: AC
  Administered 2017-05-16: 50 ug via INTRAVENOUS
  Filled 2017-05-16: qty 2

## 2017-05-16 MED ORDER — OXYCODONE HCL 5 MG PO TABS
5.0000 mg | ORAL_TABLET | ORAL | Status: DC | PRN
  Administered 2017-05-16 – 2017-05-17 (×3): 5 mg via ORAL
  Filled 2017-05-16 (×3): qty 1

## 2017-05-16 MED ORDER — PROPOFOL 10 MG/ML IV BOLUS
INTRAVENOUS | Status: AC | PRN
  Administered 2017-05-16: 40 mg via INTRAVENOUS

## 2017-05-16 MED ORDER — LEVOFLOXACIN IN D5W 500 MG/100ML IV SOLN
500.0000 mg | Freq: Once | INTRAVENOUS | Status: AC
  Administered 2017-05-16: 500 mg via INTRAVENOUS
  Filled 2017-05-16: qty 100

## 2017-05-16 MED ORDER — SODIUM CHLORIDE 0.9% FLUSH: 3.0000 mL | INTRAVENOUS | Status: DC | PRN

## 2017-05-16 NOTE — Consult Note (Addendum)
Medicine Consultation   Lori Krueger YIR:[id number] DOB: 11/21/1934 DOA: 05/16/2017  PCP: Haywood Pao, MD  Patient coming from: home  Chief Complaint: fall injured right shoulder  HPI: Lori Krueger is a 83 y.o. female with medical history significant of HTN comes in after tripping and falling and injuring her right shoulder.  She had no LOC.  She lives alone and is very active.  Shoulder was attempted to be reduced in ED unsuccessfully.  Pt got hypoxic after sedation with now normal oxygen sats at 100% during my interview.  Ortho was called who advised ct scan and then obs overnight.  Pt does report a uri and cough for the last week or so.  No fevers.  No le edema or swelling.  No chest pain.  Review of Systems: As per HPI otherwise 10 point review of systems negative.   Past Medical History:  Diagnosis Date  . Dyslipidemia   . Hypothyroidism   . Osteoporosis   . Skin cancer   . SVT (supraventricular tachycardia) (HCC)     Past Surgical History:  Procedure Laterality Date  . BUNIONECTOMY    . Hammer toe repair       reports that  has never smoked. she has never used smokeless tobacco. She reports that she does not drink alcohol or use drugs.  Allergies  Allergen Reactions  . Codeine Nausea And Vomiting  . Lactose Intolerance (Gi) Diarrhea    Family History  Problem Relation Age of Onset  . Stroke Mother 18  . Alcohol abuse Father 57    Prior to Admission medications   Medication Sig Start Date End Date Taking? Authorizing Provider  acetaminophen (TYLENOL) 500 MG tablet Take 500 mg by mouth every 6 (six) hours as needed (for pain).   Yes [provider]  aspirin EC 81 MG tablet Take 81 mg by mouth daily.   Yes [provider]  Cholecalciferol (VITAMIN D-3) 1000 units CAPS Take 1,000 Units by mouth daily.   Yes [provider]  levothyroxine (SYNTHROID, LEVOTHROID) 100 MCG tablet Take 100 mcg by mouth See admin instructions. 100 mcg in the  morning on Mon/Tues/Wed/Thurs/Fri/Sat, skip Sundays   Yes [provider]  metoprolol succinate (TOPROL-XL) 25 MG 24 hr tablet Take 25 mg by mouth daily.   Yes [provider]  Multiple Vitamins-Minerals (MULTIVITAMINS THER. W/MINERALS) TABS Take 1 tablet by mouth daily.   Yes [provider]  VESICARE 5 MG tablet Take 1 tablet by mouth daily. 09/09/15  Yes [provider]    Physical Exam: Vitals:   05/16/17 1645 05/16/17 1715 05/16/17 1800 05/16/17 1900  BP: 135/78 128/71 132/68 (!) 144/72  Pulse: 84 82 85 85  Resp: (!) 26 19 16  (!) 23  Temp:      TempSrc:      SpO2: 95% 97% 97% 96%  Weight:      Height:          Constitutional: NAD, calm, comfortable Vitals:   05/16/17 1645 05/16/17 1715 05/16/17 1800 05/16/17 1900  BP: 135/78 128/71 132/68 (!) 144/72  Pulse: 84 82 85 85  Resp: (!) 26 19 16  (!) 23  Temp:      TempSrc:      SpO2: 95% 97% 97% 96%  Weight:      Height:       Eyes: PERRL, lids and conjunctivae normal ENMT: Mucous membranes are moist. Posterior pharynx clear of any exudate or lesions.Normal dentition.  Neck: normal, supple, no  masses, no thyromegaly Respiratory: clear to auscultation bilaterally, no wheezing, no crackles. Normal respiratory effort. No accessory muscle use.  Cardiovascular: Regular rate and rhythm, no murmurs / rubs / gallops. No extremity edema. 2+ pedal pulses. No carotid bruits.  Abdomen: no tenderness, no masses palpated. No hepatosplenomegaly. Bowel sounds positive.  Musculoskeletal: no clubbing / cyanosis. No joint deformity upper and lower extremities. Good ROM, no contractures. Normal muscle tone. X rt shoulder in sling Skin: no rashes, lesions, ulcers. No induration Neurologic: CN 2-12 grossly intact. Sensation intact, DTR normal. Strength 5/5 in all 4.  Psychiatric: Normal judgment and insight. Alert and oriented x 3. Normal mood.    Labs on Admission: I have personally reviewed following labs and  imaging studies  CBC: Recent Labs  Lab 05/16/17 1359 05/16/17 1854  WBC  --  14.7*  NEUTROABS  --  13.6*  HGB 12.9 12.1  HCT 38.0 37.2  MCV  --  97.4  PLT  --  696   Basic Metabolic Panel: Recent Labs  Lab 05/16/17 1359 05/16/17 1854  NA 140 135  K 4.0 4.5  CL 100* 99*  CO2  --  25  GLUCOSE 164* 145*  BUN 19 17  CREATININE 0.70 0.64  CALCIUM  --  8.8*   GFR: Estimated Creatinine Clearance: 49.1 mL/min (by C-G formula based on SCr of 0.64 mg/dL). Liver Function Tests: No results for input(s): AST, ALT, ALKPHOS, BILITOT, PROT, ALBUMIN in the last 168 hours. No results for input(s): LIPASE, AMYLASE in the last 168 hours. No results for input(s): AMMONIA in the last 168 hours. Coagulation Profile: No results for input(s): INR, PROTIME in the last 168 hours. Cardiac Enzymes: No results for input(s): CKTOTAL, CKMB, CKMBINDEX, TROPONINI in the last 168 hours. BNP (last 3 results) No results for input(s): PROBNP in the last 8760 hours. HbA1C: No results for input(s): HGBA1C in the last 72 hours. CBG: No results for input(s): GLUCAP in the last 168 hours. Lipid Profile: No results for input(s): CHOL, HDL, LDLCALC, TRIG, CHOLHDL, LDLDIRECT in the last 72 hours. Thyroid Function Tests: No results for input(s): TSH, T4TOTAL, FREET4, T3FREE, THYROIDAB in the last 72 hours. Anemia Panel: No results for input(s): VITAMINB12, FOLATE, FERRITIN, TIBC, IRON, RETICCTPCT in the last 72 hours. Urine analysis: No results found for: COLORURINE, APPEARANCEUR, LABSPEC, PHURINE, GLUCOSEU, HGBUR, BILIRUBINUR, KETONESUR, PROTEINUR, UROBILINOGEN, NITRITE, LEUKOCYTESUR Sepsis Labs: !!!!!!!!!!!!!!!!!!!!!!!!!!!!!!!!!!!!!!!!!!!! @LABRCNTIP (procalcitonin:4,lacticidven:4) )No results found for this or any previous visit (from the past 240 hour(s)).   Radiological Exams on Admission: Dg Shoulder Right  Result Date: 05/16/2017 CLINICAL DATA:  [place] on RIGHT side, severe RIGHT shoulder pain, pain  with palpation EXAM: RIGHT SHOULDER - 2+ VIEW COMPARISON:  None FINDINGS: Osseous demineralization. AC joint alignment normal. Fracture dislocation of the RIGHT humeral head/neck including a displaced greater tuberosity fragment. Inferior dislocation of the RIGHT humeral head. Visualized ribs intact. No additional fractures identified. IMPRESSION: Comminuted fracture of the proximal RIGHT humerus with inferior RIGHT glenohumeral dislocation. Electronically Signed   By: Lavonia Dana M.D.   On: 05/16/2017 12:55   Ct Head Wo Contrast  Result Date: 05/16/2017 CLINICAL DATA:  Fall, right eye laceration EXAM: CT HEAD WITHOUT CONTRAST TECHNIQUE: Contiguous axial images were obtained from the base of the skull through the vertex without intravenous contrast. COMPARISON:  08/16/2016 FINDINGS: Brain: No evidence of acute infarction, hemorrhage, hydrocephalus, extra-axial collection or mass lesion/mass effect. Mild subcortical white matter and periventricular small vessel ischemic changes. Vascular: No hyperdense vessel or unexpected calcification. Skull: Normal.  Negative for fracture or focal lesion. Sinuses/Orbits: The visualized paranasal sinuses are essentially clear. The mastoid air cells are unopacified. Other: Mild soft tissue swelling/hematoma along the lateral aspect of the right orbit/zygoma (series 3/ image 12). IMPRESSION: Mild soft swelling/hematoma along the lateral aspect of the right orbit/ zygoma. No evidence of acute intracranial abnormality. Mild small vessel ischemic changes. Electronically Signed   By: Julian Hy M.D.   On: 05/16/2017 14:56   Ct Shoulder Right Wo Contrast  Result Date: 05/16/2017 CLINICAL DATA:  Right shoulder fracture. EXAM: CT OF THE UPPER RIGHT EXTREMITY WITHOUT CONTRAST TECHNIQUE: Multidetector CT imaging of the upper right extremity was performed according to the standard protocol. COMPARISON:  None. FINDINGS: Bones/Joint/Cartilage Acute comminuted fracture of the surgical  neck of the right proximal humerus. Comminuted fracture of the superior lateral humeral head involving the greater and lesser tuberosities. Greater tuberosity is peripherally displaced 19 mm. Two fracture fragments are rotated approximately 90 degrees. Humeral head is anterior and inferiorly dislocated relative to the glenoid. Large amount of fluid in the subacromial/subdeltoid bursa with high density material dependently likely reflecting hemorrhagic bursitis. Moderate joint effusion. No other acute fracture or dislocation. No joint effusion. Ligaments Ligaments are suboptimally evaluated by CT. Muscles and Tendons No muscle atrophy. Soft tissue No fluid collection or hematoma. No soft tissue mass. Left lower lobe airspace disease may reflect atelectasis versus pneumonia. IMPRESSION: 1. Comminuted and displaced fracture of the surgical neck and head of the humerus. Anterior inferior dislocation of the humeral head relative to the glenoid. Large subacromial/subdeltoid hemorrhagic bursitis. 2. Right lower lobe airspace disease which may reflect atelectasis versus pneumonia. Electronically Signed   By: Kathreen Devoid   On: 05/16/2017 17:59   Dg Chest Port 1 View  Result Date: 05/16/2017 CLINICAL DATA:  Pt states she has had a dry cough at night for the past couple of days after a cold. Unable to remove bra due to pt being immobilized in sling for right shoulder. Pt states "she has had all the pain she can take." EXAM: PORTABLE CHEST 1 VIEW COMPARISON:  05/16/2017, 04/19/2014 FINDINGS: Shallow lung inflation. Heart size is accentuated by low lung volumes. There are no focal consolidations or pleural effusions. No pulmonary edema. There is minimal bibasilar atelectasis. Comminuted, displaced right shoulder fracture. IMPRESSION: No evidence for acute cardiopulmonary abnormality. Electronically Signed   By: Nolon Nations M.D.   On: 05/16/2017 20:03   Dg Shoulder Right Portable  Result Date: 05/16/2017 CLINICAL  DATA:  Post reduction. EXAM: PORTABLE RIGHT SHOULDER COMPARISON:  May 16, 2017 FINDINGS: A comminuted fracture of the right humeral head/neck persists. There is increased displacement of the humeral head. The humeral head remains dislocated anteriorly and inferiorly. No other changes. IMPRESSION: Comminuted fracture of the proximal right humerus as above with increased displacement of the humeral head. Continued dislocation of the humeral head. Electronically Signed   By: Dorise Bullion III M.D   On: 05/16/2017 16:19    cxr reviewed no edema or infiltrate Old chart reviewed   Assessment/Plan 83 yo female with proximal right humerus fracture with displacement of humeral head with h/o HTN  Principal Problem:   Hypertension- cont home meds   Right humeral displaced fracture- per ortho team, will hold off on anticoagulation until surgical plan clear   Hypoxia - resolved.  No infiltrate on cxr.  Given abx in ED, will not continue at this time.  IS.   Will follow along daily  Lori Krueger A MD [facility]  If 7PM-7AM, please contact night-coverage [email or web address] Password [facility]  05/16/2017, 8:08 PM   I was reported by dr Lita Mains that dr haddix wanted pt admitted for surgical evaluation.  I have spoken to dr haddix who recommended outpt follow up on Wednesday for her shoulder.  Will obs overnight for her hypoxia which has resolved , cxr is neg.

## 2017-05-16 NOTE — ED Notes (Addendum)
Lori Krueger - son  [phone]  Lori Krueger -  [phone]           Cell [phone]

## 2017-05-16 NOTE — ED Provider Notes (Signed)
[facility] EMERGENCY DEPARTMENT Provider Note   CSN: [id number] Arrival date & time: 05/16/17  1220     History   Chief Complaint Chief Complaint  Patient presents with  . Fall  . Laceration  . Shoulder Injury    HPI Lori Krueger is a 83 y.o. female.  HPI Patient had a fall from standing while leaving church.  Denies dizziness or lightheadedness.  No loss of consciousness.  Patient did hit the side of her head and sustained laceration.  No neck pain.  Patient mostly is complaining of left shoulder and upper arm pain.  Pain with movement of the shoulder.  Denies any numbness or tingling of the hand.  Denies pelvic pain.  No focal weakness.  Unknown last tetanus. Past Medical History:  Diagnosis Date  . Dyslipidemia   . Hypothyroidism   . Osteoporosis   . Skin cancer   . SVT (supraventricular tachycardia) [facility])     Patient Active Problem List   Diagnosis Date Noted  . Chest pain at rest 04/23/2014  . SVT (supraventricular tachycardia) ([place]) 06/26/2011  . Hypertension 06/26/2011    Past Surgical History:  Procedure Laterality Date  . BUNIONECTOMY    . Hammer toe repair      OB History    No data available       Home Medications    Prior to Admission medications   Medication Sig Start Date End Date Taking? Authorizing Provider  aspirin EC 81 MG tablet Take 81 mg by mouth daily.    [provider]  calcium carbonate (OS-CAL) 600 MG TABS Take 600 mg by mouth daily.    [provider]  cholecalciferol (VITAMIN D) 1000 UNITS tablet Take 1,000 Units by mouth daily.    [provider]  levothyroxine (SYNTHROID, LEVOTHROID) 100 MCG tablet Take 100 mcg by mouth daily.    [provider]  metoprolol succinate (TOPROL-XL) 25 MG 24 hr tablet Take 25 mg by mouth daily.    [provider]  Multiple Vitamins-Minerals (MULTIVITAMINS THER. W/MINERALS) TABS Take 1 tablet by mouth daily.    [provider]    VESICARE 5 MG tablet Take 1 tablet by mouth daily. 09/09/15   [provider]    Family History Family History  Problem Relation Age of Onset  . Stroke Mother 33  . Alcohol abuse Father 68    Social History Social History   Tobacco Use  . Smoking status: Never Smoker  . Smokeless tobacco: Never Used  Substance Use Topics  . Alcohol use: No  . Drug use: No     Allergies   Codeine   Review of Systems Review of Systems  Constitutional: Negative for chills and fever.  HENT: Positive for facial swelling.   Eyes: Negative for visual disturbance.  Respiratory: Negative for cough and shortness of breath.   Cardiovascular: Negative for chest pain.  Gastrointestinal: Negative for abdominal pain, nausea and vomiting.  Genitourinary: Negative for dysuria and flank pain.  Musculoskeletal: Positive for arthralgias. Negative for myalgias and neck pain.  Skin: Positive for wound. Negative for rash.  Neurological: Negative for dizziness, syncope, weakness, light-headedness, numbness and headaches.  All other systems reviewed and are negative.    Physical Exam Updated Vital Signs BP (!) 144/72   Pulse 85   Temp 97.6 F (36.4 C) (Oral)   Resp (!) 23   Ht 5\' 2"  (1.575 m)   Wt 63.5 kg (140 lb)   SpO2 96%  BMI 25.61 kg/m   Physical Exam  Constitutional: She is oriented to person, place, and time. She appears well-developed and well-nourished.  HENT:  Head: Normocephalic.  Mouth/Throat: Oropharynx is clear and moist.  Patient with 2 cm laceration over the lateral right eyebrow.  No active bleeding.  Wound edges closely aligned.  Midface is stable.  No malocclusion.  Eyes: EOM are normal. Pupils are equal, round, and reactive to light.  Neck: Normal range of motion. Neck supple.  No posterior midline cervical tenderness to palpation.  Cardiovascular: Normal rate and regular rhythm.  Pulmonary/Chest: Effort normal and breath sounds normal. No stridor. No respiratory  distress. She has no wheezes. She has no rales. She exhibits no tenderness.  Abdominal: Soft. Bowel sounds are normal. There is no tenderness. There is no rebound and no guarding.  Musculoskeletal: She exhibits tenderness and deformity. She exhibits no edema.  Patient with tenderness deformity of the proximal right humerus.  There is anterior fullness.  Full range of motion of the right elbow and wrist.  Distal pulses are 2+.  Neurological: She is alert and oriented to person, place, and time.  5/5 bilateral grip strength.  Sensation fully intact.  Skin: Skin is warm and dry. Capillary refill takes less than 2 seconds. No rash noted. No erythema.  Psychiatric: She has a normal mood and affect. Her behavior is normal.  Nursing note and vitals reviewed.    ED Treatments / Results  Labs (all labs ordered are listed, but only abnormal results are displayed) Labs Reviewed  CBC WITH DIFFERENTIAL/PLATELET - Abnormal; Notable for the following components:      Result Value   WBC 14.7 (*)    RBC 3.82 (*)    Neutro Abs 13.6 (*)    All other components within normal limits  I-STAT CHEM 8, ED - Abnormal; Notable for the following components:   Chloride 100 (*)    Glucose, Bld 164 (*)    Calcium, Ion 1.14 (*)    All other components within normal limits  BASIC METABOLIC PANEL  URINALYSIS, ROUTINE W REFLEX MICROSCOPIC    EKG  EKG Interpretation None       Radiology Dg Shoulder Right  Result Date: 05/16/2017 CLINICAL DATA:  [place] on RIGHT side, severe RIGHT shoulder pain, pain with palpation EXAM: RIGHT SHOULDER - 2+ VIEW COMPARISON:  None FINDINGS: Osseous demineralization. AC joint alignment normal. Fracture dislocation of the RIGHT humeral head/neck including a displaced greater tuberosity fragment. Inferior dislocation of the RIGHT humeral head. Visualized ribs intact. No additional fractures identified. IMPRESSION: Comminuted fracture of the proximal RIGHT humerus with inferior RIGHT  glenohumeral dislocation. Electronically Signed   By: Lavonia Dana M.D.   On: 05/16/2017 12:55   Ct Head Wo Contrast  Result Date: 05/16/2017 CLINICAL DATA:  Fall, right eye laceration EXAM: CT HEAD WITHOUT CONTRAST TECHNIQUE: Contiguous axial images were obtained from the base of the skull through the vertex without intravenous contrast. COMPARISON:  08/16/2016 FINDINGS: Brain: No evidence of acute infarction, hemorrhage, hydrocephalus, extra-axial collection or mass lesion/mass effect. Mild subcortical white matter and periventricular small vessel ischemic changes. Vascular: No hyperdense vessel or unexpected calcification. Skull: Normal. Negative for fracture or focal lesion. Sinuses/Orbits: The visualized paranasal sinuses are essentially clear. The mastoid air cells are unopacified. Other: Mild soft tissue swelling/hematoma along the lateral aspect of the right orbit/zygoma (series 3/ image 12). IMPRESSION: Mild soft swelling/hematoma along the lateral aspect of the right orbit/ zygoma. No evidence of acute intracranial abnormality. Mild  small vessel ischemic changes. Electronically Signed   By: Julian Hy M.D.   On: 05/16/2017 14:56   Ct Shoulder Right Wo Contrast  Result Date: 05/16/2017 CLINICAL DATA:  Right shoulder fracture. EXAM: CT OF THE UPPER RIGHT EXTREMITY WITHOUT CONTRAST TECHNIQUE: Multidetector CT imaging of the upper right extremity was performed according to the standard protocol. COMPARISON:  None. FINDINGS: Bones/Joint/Cartilage Acute comminuted fracture of the surgical neck of the right proximal humerus. Comminuted fracture of the superior lateral humeral head involving the greater and lesser tuberosities. Greater tuberosity is peripherally displaced 19 mm. Two fracture fragments are rotated approximately 90 degrees. Humeral head is anterior and inferiorly dislocated relative to the glenoid. Large amount of fluid in the subacromial/subdeltoid bursa with high density material  dependently likely reflecting hemorrhagic bursitis. Moderate joint effusion. No other acute fracture or dislocation. No joint effusion. Ligaments Ligaments are suboptimally evaluated by CT. Muscles and Tendons No muscle atrophy. Soft tissue No fluid collection or hematoma. No soft tissue mass. Left lower lobe airspace disease may reflect atelectasis versus pneumonia. IMPRESSION: 1. Comminuted and displaced fracture of the surgical neck and head of the humerus. Anterior inferior dislocation of the humeral head relative to the glenoid. Large subacromial/subdeltoid hemorrhagic bursitis. 2. Right lower lobe airspace disease which may reflect atelectasis versus pneumonia. Electronically Signed   By: Kathreen Devoid   On: 05/16/2017 17:59   Dg Shoulder Right Portable  Result Date: 05/16/2017 CLINICAL DATA:  Post reduction. EXAM: PORTABLE RIGHT SHOULDER COMPARISON:  May 16, 2017 FINDINGS: A comminuted fracture of the right humeral head/neck persists. There is increased displacement of the humeral head. The humeral head remains dislocated anteriorly and inferiorly. No other changes. IMPRESSION: Comminuted fracture of the proximal right humerus as above with increased displacement of the humeral head. Continued dislocation of the humeral head. Electronically Signed   By: Dorise Bullion III M.D   On: 05/16/2017 16:19    Procedures .Sedation Date/Time: 05/16/2017 3:55 PM Performed by: Julianne Rice, MD Authorized by: Julianne Rice, MD   Consent:    Consent obtained:  Written   Consent given by:  Patient Universal protocol:    Procedure explained and questions answered to patient or proxy's satisfaction: yes     Relevant documents present and verified: yes     Imaging studies available: yes     Immediately prior to procedure a time out was called: yes     Patient identity confirmation method:  Verbally with patient and arm band Indications:    Procedure performed:  Dislocation reduction   Procedure  necessitating sedation performed by:  Physician performing sedation   Intended level of sedation:  Moderate (conscious sedation) Pre-sedation assessment:    Time since last food or drink:  12 hours   ASA classification: class 2 - patient with mild systemic disease     Neck mobility: normal     Mouth opening:  3 or more finger widths   Thyromental distance:  3 finger widths   Mallampati score:  I - soft palate, uvula, fauces, pillars visible   Pre-sedation assessments completed and reviewed: airway patency, cardiovascular function, hydration status, mental status, nausea/vomiting, pain level, respiratory function and temperature   Immediate pre-procedure details:    Reassessment: Patient reassessed immediately prior to procedure     Reviewed: vital signs, relevant labs/tests and NPO status     Verified: bag valve mask available, emergency equipment available, intubation equipment available, IV patency confirmed, oxygen available and suction available   Procedure details (see  MAR for exact dosages):    Preoxygenation:  Nasal cannula   Sedation:  Propofol   Analgesia:  Fentanyl   Intra-procedure monitoring:  Blood pressure monitoring, continuous capnometry, frequent LOC assessments, frequent vital sign checks, continuous pulse oximetry and cardiac monitor   Intra-procedure events: none     Total Provider sedation time (minutes):  20  Reduction of dislocation Date/Time: 05/16/2017 5:51 PM Performed by: Julianne Rice, MD Authorized by: Julianne Rice, MD  Consent: Verbal consent obtained. Written consent obtained. Risks and benefits: risks, benefits and alternatives were discussed Consent given by: patient Imaging studies: imaging studies available Patient identity confirmed: verbally with patient and arm band Time out: Immediately prior to procedure a "time out" was called to verify the correct patient, procedure, equipment, support staff and site/side marked as required. Local  anesthesia used: no  Anesthesia: Local anesthesia used: no  Sedation: Patient sedated: yes Sedatives: propofol Vitals: Vital signs were monitored during sedation.  Patient tolerance: Patient tolerated the procedure well with no immediate complications Comments: Unsuccessful attempted right shoulder reduction.  Remains neurovascularly intact after attempted.    (including critical care time)  Medications Ordered in ED Medications  propofol (DIPRIVAN) 10 mg/mL bolus/IV push 31.8 mg (not administered)  levofloxacin (LEVAQUIN) IVPB 500 mg (500 mg Intravenous New Bag/Given 05/16/17 1903)  fentaNYL (SUBLIMAZE) injection 50 mcg (50 mcg Intravenous Given 05/16/17 1346)  Tdap (BOOSTRIX) injection 0.5 mL (0.5 mLs Intramuscular Given 05/16/17 1534)  propofol (DIPRIVAN) 10 mg/mL bolus/IV push (40 mg Intravenous Given 05/16/17 1552)  fentaNYL (SUBLIMAZE) injection 50 mcg (50 mcg Intravenous Given 05/16/17 1723)  oxyCODONE-acetaminophen (PERCOCET/ROXICET) 5-325 MG per tablet 1 tablet (1 tablet Oral Given 05/16/17 1827)  oxyCODONE-acetaminophen (PERCOCET/ROXICET) 5-325 MG per tablet 1 tablet (1 tablet Oral Given 05/16/17 1827)     Initial Impression / Assessment and Plan / ED Course  I have reviewed the triage vital signs and the nursing notes.  Pertinent labs & imaging results that were available during my care of the patient were reviewed by me and considered in my medical decision making (see chart for details).     Discussed with Dr. Doreatha Martin who reviewed patient's x-ray.  Suggested attempted reduction of the fracture dislocation in the emergency department.  Unsuccessful reduction attempt.  Dr. Doreatha Martin suggested getting CT of the shoulder and patient will follow up with Dr. Maryruth Eve.  Eyebrow laceration was repaired in the emergency department.  See PA procedure note.  Tetanus was updated.  She with persistent pain despite multiple rounds of medication.  She does desat into the 80s.  Evidence of  infiltrate on CT.  Patient admits to having several days of cough this been worse at night.  Suspect possible pneumonia.  Will start on Levaquin.  Discussed with Dr. Doreatha Martin who will see patient in the morning.  Requests hospitalist admit.  With Dr. Shanon Brow who will see patient in the emergency department. Final Clinical Impressions(s) / ED Diagnoses   Final diagnoses:  Closed fracture of proximal end of right humerus, unspecified fracture morphology, initial encounter  Facial laceration, initial encounter  Community acquired pneumonia of right lower lobe of lung [facility])    ED Discharge Orders    None       Julianne Rice, MD 05/16/17 1945

## 2017-05-16 NOTE — ED Triage Notes (Signed)
Pt was at church when she tripped and had a mechanical fall.  Pt is alert and oriented X4. Denies blurry vision or dizziness. Pt has a laceration to her right eye brow. Pt also has pain to her right shoulder. Not on blood thinners. Denies LOC.

## 2017-05-16 NOTE — ED Notes (Signed)
Pt CBG 147, notified [employer], corporate)

## 2017-05-17 ENCOUNTER — Other Ambulatory Visit: Payer: Self-pay

## 2017-05-17 ENCOUNTER — Encounter (HOSPITAL_COMMUNITY): Payer: Self-pay | Admitting: Orthopedic Surgery

## 2017-05-17 DIAGNOSIS — Z823 Family history of stroke: Secondary | ICD-10-CM | POA: Diagnosis not present

## 2017-05-17 DIAGNOSIS — Z811 Family history of alcohol abuse and dependence: Secondary | ICD-10-CM | POA: Diagnosis not present

## 2017-05-17 DIAGNOSIS — Z85828 Personal history of other malignant neoplasm of skin: Secondary | ICD-10-CM | POA: Diagnosis not present

## 2017-05-17 DIAGNOSIS — J069 Acute upper respiratory infection, unspecified: Secondary | ICD-10-CM | POA: Diagnosis present

## 2017-05-17 DIAGNOSIS — Z23 Encounter for immunization: Secondary | ICD-10-CM | POA: Diagnosis present

## 2017-05-17 DIAGNOSIS — W010XXA Fall on same level from slipping, tripping and stumbling without subsequent striking against object, initial encounter: Secondary | ICD-10-CM | POA: Diagnosis not present

## 2017-05-17 DIAGNOSIS — E039 Hypothyroidism, unspecified: Secondary | ICD-10-CM | POA: Diagnosis present

## 2017-05-17 DIAGNOSIS — S42291A Other displaced fracture of upper end of right humerus, initial encounter for closed fracture: Secondary | ICD-10-CM | POA: Diagnosis present

## 2017-05-17 DIAGNOSIS — Z885 Allergy status to narcotic agent status: Secondary | ICD-10-CM | POA: Diagnosis not present

## 2017-05-17 DIAGNOSIS — S43034A Inferior dislocation of right humerus, initial encounter: Secondary | ICD-10-CM | POA: Diagnosis present

## 2017-05-17 DIAGNOSIS — Z91011 Allergy to milk products: Secondary | ICD-10-CM | POA: Diagnosis not present

## 2017-05-17 DIAGNOSIS — M6588 Other synovitis and tenosynovitis, other site: Secondary | ICD-10-CM | POA: Diagnosis present

## 2017-05-17 DIAGNOSIS — R0902 Hypoxemia: Secondary | ICD-10-CM

## 2017-05-17 DIAGNOSIS — M810 Age-related osteoporosis without current pathological fracture: Secondary | ICD-10-CM | POA: Diagnosis present

## 2017-05-17 DIAGNOSIS — Y9222 Religious institution as the place of occurrence of the external cause: Secondary | ICD-10-CM | POA: Diagnosis not present

## 2017-05-17 DIAGNOSIS — Z79890 Hormone replacement therapy (postmenopausal): Secondary | ICD-10-CM | POA: Diagnosis not present

## 2017-05-17 DIAGNOSIS — S42201A Unspecified fracture of upper end of right humerus, initial encounter for closed fracture: Secondary | ICD-10-CM | POA: Diagnosis not present

## 2017-05-17 DIAGNOSIS — E785 Hyperlipidemia, unspecified: Secondary | ICD-10-CM | POA: Diagnosis present

## 2017-05-17 DIAGNOSIS — Y92230 Patient room in hospital as the place of occurrence of the external cause: Secondary | ICD-10-CM | POA: Diagnosis not present

## 2017-05-17 DIAGNOSIS — Z79899 Other long term (current) drug therapy: Secondary | ICD-10-CM | POA: Diagnosis not present

## 2017-05-17 DIAGNOSIS — T4275XA Adverse effect of unspecified antiepileptic and sedative-hypnotic drugs, initial encounter: Secondary | ICD-10-CM | POA: Diagnosis not present

## 2017-05-17 DIAGNOSIS — Z7982 Long term (current) use of aspirin: Secondary | ICD-10-CM | POA: Diagnosis not present

## 2017-05-17 DIAGNOSIS — I471 Supraventricular tachycardia: Secondary | ICD-10-CM | POA: Diagnosis present

## 2017-05-17 DIAGNOSIS — S0181XA Laceration without foreign body of other part of head, initial encounter: Secondary | ICD-10-CM | POA: Diagnosis present

## 2017-05-17 DIAGNOSIS — I10 Essential (primary) hypertension: Secondary | ICD-10-CM | POA: Diagnosis present

## 2017-05-17 LAB — SURGICAL PCR SCREEN
MRSA, PCR: NEGATIVE
STAPHYLOCOCCUS AUREUS: NEGATIVE

## 2017-05-17 LAB — PROCALCITONIN: Procalcitonin: <0.1 ng/mL

## 2017-05-17 MED ORDER — PROMETHAZINE HCL 25 MG/ML IJ SOLN: 6.2500 mg | Freq: Four times a day (QID) | INTRAMUSCULAR | Status: DC | PRN

## 2017-05-17 MED ORDER — TRAMADOL HCL 50 MG PO TABS
50.0000 mg | ORAL_TABLET | Freq: Four times a day (QID) | ORAL | Status: DC | PRN
  Administered 2017-05-18 – 2017-05-19 (×4): 50 mg via ORAL
  Filled 2017-05-17 (×4): qty 1

## 2017-05-17 MED ORDER — SALINE SPRAY 0.65 % NA SOLN
1.0000 | NASAL | Status: DC | PRN
  Administered 2017-05-17: 1 via NASAL
  Filled 2017-05-17: qty 44

## 2017-05-17 MED ORDER — ONDANSETRON HCL 4 MG/2ML IJ SOLN
4.0000 mg | Freq: Four times a day (QID) | INTRAMUSCULAR | Status: DC | PRN
  Administered 2017-05-17 – 2017-05-20 (×6): 4 mg via INTRAVENOUS
  Filled 2017-05-17 (×6): qty 2

## 2017-05-17 NOTE — H&P (Signed)
  History and Physical    Kriston Mckinnie ONG:[id number] DOB: 1934/08/11 DOA: 05/16/2017  Refer to consult note

## 2017-05-17 NOTE — Consult Note (Signed)
[facility] (OTS) Consult   Patient ID: Lori Krueger MRN: [id number] DOB/AGE: [birth year] 83 y.o.  Reason for Consult:right proximal humerus fracture dislocation Referring Physician: Elby Beck, MD of [facility]   HPI: Lori Krueger is an 83 y.o. female who is being seen in consultation at the request of Dr. Lita Mains for evaluation of right proximal humerus fracture dislocation.  This is a left-hand dominant female who presents after a fall at church she sustained a fracture dislocation.  I asked the emergency room to reduce her unfortunately due to the fracture they were unable to successfully do so.  With the amount of displacement and bone stock I felt that proceeding with reduction was done.  She was also having some chronic cough and some desaturations after reduction so she was brought into the hospital for pain control as well as workup for pneumonia.  She currently is breathing better now.  Her pain is still uncomfortable but is manageable when she does not move her arm..  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   Past Medical History:  Diagnosis Date  . Dyslipidemia   . Hypothyroidism   . Osteoporosis   . Skin cancer   . SVT (supraventricular tachycardia) ([facility])     Past Surgical History:  Procedure Laterality Date  . BUNIONECTOMY    . Hammer toe repair      Family History  Problem Relation Age of Onset  . Stroke Mother 74  . Alcohol abuse Father 55    Social History:  reports that  has never smoked. she has never used smokeless tobacco. She reports that she does not drink alcohol or use drugs.  Allergies:  Allergies  Allergen Reactions  . Codeine Nausea And Vomiting  . Lactose Intolerance (Gi) Diarrhea    Medications:  No current facility-administered medications on file prior to encounter.    Current Outpatient Medications on File Prior to Encounter  Medication Sig Dispense Refill  . acetaminophen (TYLENOL)  500 MG tablet Take 500 mg by mouth every 6 (six) hours as needed (for pain).    Marland Kitchen aspirin EC 81 MG tablet Take 81 mg by mouth daily.    . Cholecalciferol (VITAMIN D-3) 1000 units CAPS Take 1,000 Units by mouth daily.    Marland Kitchen levothyroxine (SYNTHROID, LEVOTHROID) 100 MCG tablet Take 100 mcg by mouth See admin instructions. 100 mcg in the morning on Mon/Tues/Wed/Thurs/Fri/Sat, skip Sundays    . metoprolol succinate (TOPROL-XL) 25 MG 24 hr tablet Take 25 mg by mouth daily.    . Multiple Vitamins-Minerals (MULTIVITAMINS THER. W/MINERALS) TABS Take 1 tablet by mouth daily.    . VESICARE 5 MG tablet Take 1 tablet by mouth daily.      ROS: Constitutional: No fever or chills Vision: No changes in vision ENT: No difficulty swallowing CV: No chest pain Pulm: +cough GI: +Nausea overnight GU: No urgency or inability to hold urine Skin: No poor wound healing Neurologic: No numbness or tingling Psychiatric: No depression or anxiety Heme: No bruising Allergic: No reaction to medications or food   Exam: Blood pressure (!) 115/57, pulse 78, temperature 98.3 F (36.8 C), temperature source Oral, resp. rate 17, height 5\' 1"  (2.683 m), weight 63.5 kg (140 lb), SpO2 92 %. General:No acute distress Orientation:AAOx3 Mood and Affect: Cooperative and pleasant Gait: Not assessed Coordination and balance: Within normal limits  Injured Extremity (CV, lymph, sensation, reflexes): In sling, +Swelling and mild ecchymosis. No wounds. Active deltoid contraction and  sensation to axillary nerve distribution. Motor and sensory intact otherwise through the arm. Compartments soft and compressible. No deformity or instabiltiy at elbow or wrist. Warm and well perfused hand. No lymphadenopathy and normal reflexes  LUE and FVC:BSWH without lesions. No tenderness to palpation. Full painless ROM, full strength in each muscle groups without evidence of instability.  Medical Decision Making: Imaging: X-rays and CT scan of her  right proximal humerus are reviewed.  It shows a fracture dislocation of the humeral head and a associated surgical neck fracture with significant displacement of the greater and lesser tuberosities.  The humeral head is still dislocated after attempted reduction.  She has a significant osteopenia proximal humerus.  Labs:  CBC    Component Value Date/Time   WBC 14.7 (H) 05/16/2017 1854   RBC 3.82 (L) 05/16/2017 1854   HGB 12.1 05/16/2017 1854   HCT 37.2 05/16/2017 1854   PLT 294 05/16/2017 1854   MCV 97.4 05/16/2017 1854   MCH 31.7 05/16/2017 1854   MCHC 32.5 05/16/2017 1854   RDW 13.4 05/16/2017 1854   LYMPHSABS 0.7 05/16/2017 1854   MONOABS 0.3 05/16/2017 1854   EOSABS 0.0 05/16/2017 1854   BASOSABS 0.0 05/16/2017 1854   Medical history and chart was reviewed  Assessment/Plan: 83 year old female left-hand-dominant with a right proximal humerus fracture dislocation  Due to her age and bone stock I feel that open reduction internal fixation is not an option.  She is a high risk of failure.  With her deltoid functioning and her axillary nerve intact I feel that the most reasonable option at this point would be a reverse total shoulder arthroplasty.  I have conferred with Dr. Griffin Basil 1 of our orthopedic shoulder specialist and he will plan to take her to surgery for reverse total shoulder likely later this week.  Due to her living situation as she lives alone at home and her pain control issues I feel that it is reasonable to keep her for mobilization with physical therapy and likely keep her in the hospital until Wednesday when we can proceed with her replacement.  I briefly discussed risks and benefits with the patient.  Her and her son are agreeable to the above plan.  Plan to continue nonweightbearing to the right upper extremity in a sling.  Shona Needles, MD [facility] [phone] (phone)

## 2017-05-17 NOTE — Progress Notes (Signed)
PROGRESS NOTE    Lori Krueger  JEH:[id number] DOB: 02/17/1935 DOA: 05/16/2017 PCP: Haywood Pao, MD   Outpatient Specialists:     Brief Narrative:  Lori Krueger is a 83 y.o. female with medical history significant of HTN comes in after tripping and falling and injuring her right shoulder.  She had no LOC.  She lives alone and is very active.  Shoulder was attempted to be reduced in ED unsuccessfully.  Pt got hypoxic after sedation with now normal oxygen sats at 100% during my interview.  Ortho was called who advised ct scan and then obs overnight.  Pt does report a uri and cough for the last week or so.  No fevers.  No le edema or swelling.  No chest pain.     Assessment & Plan:   Principal Problem:   Hypertension Active Problems:   Hypoxia   Hypertension -continue home meds    Right humeral displaced fracture -plan for surgery on Wednesday -pain control -ambulate  Leukocytosis -suspect reactive -recheck CBC in AM  Nausea -PRN zofran/phenergan    Hypoxia  - resolved-- suspect related to sedating medications that patient received in the ED -No infiltrate on cxr and procalcitonin negative--  No abx -Incentive spirometry  Ortho offer to take over after surgery appreciated.    DVT prophylaxis:  SCD's  Code Status: Full Code   Family Communication: At bedside  Disposition Plan:  Plan for surgery on Wednesday   Consultants:   ortho    Subjective: Breathing better Shoulder hurts  Objective: Vitals:   05/16/17 2300 05/17/17 0000 05/17/17 0506 05/17/17 1455  BP: 115/65 126/67 (!) 115/57 (!) 121/59  Pulse: 88 84 78 71  Resp: 18 16 17 16   Temp:  97.8 F (36.6 C) 98.3 F (36.8 C)   TempSrc:  Oral Oral   SpO2: 95% 96% 92% 98%  Weight:  63.5 kg (140 lb)    Height:  5\' 1"  (1.549 m)      Intake/Output Summary (Last 24 hours) at 05/17/2017 1610 Last data filed at 05/17/2017 1500 Gross per 24 hour  Intake 456 ml  Output 1100 ml  Net -644 ml     Filed Weights   05/16/17 1224 05/17/17 0000  Weight: 63.5 kg (140 lb) 63.5 kg (140 lb)    Examination:  General exam: uncomfortable appearing Respiratory system: Clear to auscultation. Respiratory effort normal. Cardiovascular system: S1 & S2 heard, RRR. No JVD, murmurs, rubs, gallops or clicks. No pedal edema. Gastrointestinal system: Abdomen is nondistended, soft and nontender. No organomegaly or masses felt. Normal bowel sounds heard. Central nervous system: Alert and oriented. No focal neurological deficits. Extremities: right arm in sling Skin: No rashes, lesions or ulcers Psychiatry: Judgement and insight appear normal. Mood & affect appropriate.     Data Reviewed: I have personally reviewed following labs and imaging studies  CBC: Recent Labs  Lab 05/16/17 1359 05/16/17 1854  WBC  --  14.7*  NEUTROABS  --  13.6*  HGB 12.9 12.1  HCT 38.0 37.2  MCV  --  97.4  PLT  --  378   Basic Metabolic Panel: Recent Labs  Lab 05/16/17 1359 05/16/17 1854  NA 140 135  K 4.0 4.5  CL 100* 99*  CO2  --  25  GLUCOSE 164* 145*  BUN 19 17  CREATININE 0.70 0.64  CALCIUM  --  8.8*   GFR: Estimated Creatinine Clearance: 47.9 mL/min (by C-G formula based on SCr of 0.64 mg/dL). Liver Function  Tests: No results for input(s): AST, ALT, ALKPHOS, BILITOT, PROT, ALBUMIN in the last 168 hours. No results for input(s): LIPASE, AMYLASE in the last 168 hours. No results for input(s): AMMONIA in the last 168 hours. Coagulation Profile: No results for input(s): INR, PROTIME in the last 168 hours. Cardiac Enzymes: No results for input(s): CKTOTAL, CKMB, CKMBINDEX, TROPONINI in the last 168 hours. BNP (last 3 results) No results for input(s): PROBNP in the last 8760 hours. HbA1C: No results for input(s): HGBA1C in the last 72 hours. CBG: No results for input(s): GLUCAP in the last 168 hours. Lipid Profile: No results for input(s): CHOL, HDL, LDLCALC, TRIG, CHOLHDL, LDLDIRECT in the  last 72 hours. Thyroid Function Tests: No results for input(s): TSH, T4TOTAL, FREET4, T3FREE, THYROIDAB in the last 72 hours. Anemia Panel: No results for input(s): VITAMINB12, FOLATE, FERRITIN, TIBC, IRON, RETICCTPCT in the last 72 hours. Urine analysis: No results found for: COLORURINE, APPEARANCEUR, LABSPEC, PHURINE, GLUCOSEU, HGBUR, BILIRUBINUR, KETONESUR, PROTEINUR, UROBILINOGEN, NITRITE, LEUKOCYTESUR   ) Recent Results (from the past 240 hour(s))  Surgical PCR screen     Status: None   Collection Time: 05/17/17  1:10 AM  Result Value Ref Range Status   MRSA, PCR NEGATIVE NEGATIVE Final   Staphylococcus aureus NEGATIVE NEGATIVE Final    Comment: (NOTE) The Xpert SA Assay (FDA approved for NASAL specimens in patients 61 years of age and older), is one component of a comprehensive surveillance program. It is not intended to diagnose infection nor to guide or monitor treatment.       Anti-infectives (From admission, onward)   Start     Dose/Rate Route Frequency Ordered Stop   05/16/17 1900  levofloxacin (LEVAQUIN) IVPB 500 mg     500 mg 100 mL/hr over 60 Minutes Intravenous  Once 05/16/17 1826 05/16/17 2003       Radiology Studies: Dg Shoulder Right  Result Date: 05/16/2017 CLINICAL DATA:  [place] on RIGHT side, severe RIGHT shoulder pain, pain with palpation EXAM: RIGHT SHOULDER - 2+ VIEW COMPARISON:  None FINDINGS: Osseous demineralization. AC joint alignment normal. Fracture dislocation of the RIGHT humeral head/neck including a displaced greater tuberosity fragment. Inferior dislocation of the RIGHT humeral head. Visualized ribs intact. No additional fractures identified. IMPRESSION: Comminuted fracture of the proximal RIGHT humerus with inferior RIGHT glenohumeral dislocation. Electronically Signed   By: Lavonia Dana M.D.   On: 05/16/2017 12:55   Ct Head Wo Contrast  Result Date: 05/16/2017 CLINICAL DATA:  Fall, right eye laceration EXAM: CT HEAD WITHOUT CONTRAST TECHNIQUE:  Contiguous axial images were obtained from the base of the skull through the vertex without intravenous contrast. COMPARISON:  08/16/2016 FINDINGS: Brain: No evidence of acute infarction, hemorrhage, hydrocephalus, extra-axial collection or mass lesion/mass effect. Mild subcortical white matter and periventricular small vessel ischemic changes. Vascular: No hyperdense vessel or unexpected calcification. Skull: Normal. Negative for fracture or focal lesion. Sinuses/Orbits: The visualized paranasal sinuses are essentially clear. The mastoid air cells are unopacified. Other: Mild soft tissue swelling/hematoma along the lateral aspect of the right orbit/zygoma (series 3/ image 12). IMPRESSION: Mild soft swelling/hematoma along the lateral aspect of the right orbit/ zygoma. No evidence of acute intracranial abnormality. Mild small vessel ischemic changes. Electronically Signed   By: Julian Hy M.D.   On: 05/16/2017 14:56   Ct Shoulder Right Wo Contrast  Result Date: 05/16/2017 CLINICAL DATA:  Right shoulder fracture. EXAM: CT OF THE UPPER RIGHT EXTREMITY WITHOUT CONTRAST TECHNIQUE: Multidetector CT imaging of the upper right extremity was  performed according to the standard protocol. COMPARISON:  None. FINDINGS: Bones/Joint/Cartilage Acute comminuted fracture of the surgical neck of the right proximal humerus. Comminuted fracture of the superior lateral humeral head involving the greater and lesser tuberosities. Greater tuberosity is peripherally displaced 19 mm. Two fracture fragments are rotated approximately 90 degrees. Humeral head is anterior and inferiorly dislocated relative to the glenoid. Large amount of fluid in the subacromial/subdeltoid bursa with high density material dependently likely reflecting hemorrhagic bursitis. Moderate joint effusion. No other acute fracture or dislocation. No joint effusion. Ligaments Ligaments are suboptimally evaluated by CT. Muscles and Tendons No muscle atrophy. Soft  tissue No fluid collection or hematoma. No soft tissue mass. Left lower lobe airspace disease may reflect atelectasis versus pneumonia. IMPRESSION: 1. Comminuted and displaced fracture of the surgical neck and head of the humerus. Anterior inferior dislocation of the humeral head relative to the glenoid. Large subacromial/subdeltoid hemorrhagic bursitis. 2. Right lower lobe airspace disease which may reflect atelectasis versus pneumonia. Electronically Signed   By: Kathreen Devoid   On: 05/16/2017 17:59   Dg Chest Port 1 View  Result Date: 05/16/2017 CLINICAL DATA:  Pt states she has had a dry cough at night for the past couple of days after a cold. Unable to remove bra due to pt being immobilized in sling for right shoulder. Pt states "she has had all the pain she can take." EXAM: PORTABLE CHEST 1 VIEW COMPARISON:  05/16/2017, 04/19/2014 FINDINGS: Shallow lung inflation. Heart size is accentuated by low lung volumes. There are no focal consolidations or pleural effusions. No pulmonary edema. There is minimal bibasilar atelectasis. Comminuted, displaced right shoulder fracture. IMPRESSION: No evidence for acute cardiopulmonary abnormality. Electronically Signed   By: Nolon Nations M.D.   On: 05/16/2017 20:03   Dg Shoulder Right Portable  Result Date: 05/16/2017 CLINICAL DATA:  Post reduction. EXAM: PORTABLE RIGHT SHOULDER COMPARISON:  May 16, 2017 FINDINGS: A comminuted fracture of the right humeral head/neck persists. There is increased displacement of the humeral head. The humeral head remains dislocated anteriorly and inferiorly. No other changes. IMPRESSION: Comminuted fracture of the proximal right humerus as above with increased displacement of the humeral head. Continued dislocation of the humeral head. Electronically Signed   By: Dorise Bullion III M.D   On: 05/16/2017 16:19        Scheduled Meds: . levothyroxine  100 mcg Oral Once per day on Mon Tue Wed Thu Fri Sat  . metoprolol succinate   25 mg Oral Daily  . sodium chloride flush  3 mL Intravenous Q12H   Continuous Infusions: . sodium chloride       LOS: 0 days    Time spent: 25 min    Geradine Girt, DO [facility] Pager [phone]  If 7PM-7AM, please contact night-coverage [email or web address] Password [email or web address] 05/17/2017, 4:10 PM

## 2017-05-17 NOTE — Evaluation (Signed)
Physical Therapy Evaluation Patient Details Name: Victorian Gunn MRN: [id number] DOB: [birth year] Today's Date: 05/17/2017   History of Present Illness  Presents to the ED after fall resulting in R humeral fx; plan is for Rev TSA on 1/9;  has a past medical history of Dyslipidemia, Hypothyroidism, Osteoporosis, Skin cancer, and SVT (supraventricular tachycardia) ([place]).  Clinical Impression   Pt admitted with above diagnosis. Pt currently with functional limitations due to the deficits listed below (see PT Problem List). Plan fo rReverse TSA on 1/9; independent prior; lives alone and will need to be independent to dc home; Good rehab prognosis; rec post-acute rehab after surgery to maximize independence and safety with mobility;  Pt will benefit from skilled PT to increase their independence and safety with mobility to allow discharge to the venue listed below.       Follow Up Recommendations SNF    Equipment Recommendations  Other (comment)(to be determined postop)    Recommendations for Other Services OT consult     Precautions / Restrictions Precautions Precautions: Fall Precaution Comments: Dizzy during PT eval Restrictions RUE Weight Bearing: Non weight bearing      Mobility  Bed Mobility Overal bed mobility: Needs Assistance Bed Mobility: Supine to Sit     Supine to sit: Min guard     General bed mobility comments: got up to sitting on EOB via semi-roll onto R side; no physical assist needed  Transfers Overall transfer level: Needs assistance Equipment used: 1 person hand held assist Transfers: Sit to/from [facility] Sit to Stand: Min assist Stand pivot transfers: Min assist       General transfer comment: Min assist to steady; pt reports dizziness  Ambulation/Gait             General Gait Details: Held progressive amb today, given dizziness with upright activity and nausea  Stairs            Wheelchair Mobility    Modified  [place] (Stroke Patients Only)       Balance Overall balance assessment: Needs assistance Sitting-balance support: Feet supported Sitting balance-Leahy Scale: Fair       Standing balance-Leahy Scale: Poor                               Pertinent Vitals/Pain Pain Assessment: 0-10 Pain Score: 7  Pain Location: R shoulder Pain Descriptors / Indicators: Aching;Grimacing;Guarding Pain Intervention(s): Monitored during session    Home Living Family/patient expects to be discharged to:: Private residence Living Arrangements: Alone Available Help at Discharge: Family;Available PRN/intermittently Type of Home: House Home Access: Stairs to enter   [employer] of Steps: 2          Prior Function Level of Independence: Independent         Comments: very active, independent, volunteers here     Hand Dominance   Dominant Hand: Left    Extremity/Trunk Assessment   Upper Extremity Assessment Upper Extremity Assessment: Defer to OT evaluation(adjusted sling for proper fit)    Lower Extremity Assessment Lower Extremity Assessment: Generalized weakness       Communication   Communication: No difficulties  Cognition Arousal/Alertness: Awake/alert Behavior During Therapy: WFL for tasks assessed/performed Overall Cognitive Status: Within Functional Limits for tasks assessed  General Comments      Exercises     Assessment/Plan    PT Assessment Patient needs continued PT services  PT Problem List Decreased strength;Decreased range of motion;Decreased activity tolerance;Decreased balance;Decreased mobility;Decreased knowledge of use of DME;Decreased knowledge of precautions;Pain       PT Treatment Interventions DME instruction;Gait training;Stair training;Functional mobility training;Therapeutic activities;Therapeutic exercise;Patient/family education    PT Goals (Current goals can be  found in the Care Plan section)  Acute Rehab PT Goals Patient Stated Goal: recover and back to independence PT Goal Formulation: With patient Time For Goal Achievement: 05/31/17 Potential to Achieve Goals: Good    Frequency Min 3X/week   Barriers to discharge Decreased caregiver support      Co-evaluation               AM-PAC PT "6 Clicks" Daily Activity  Outcome Measure Difficulty turning over in bed (including adjusting bedclothes, sheets and blankets)?: A Lot Difficulty moving from lying on back to sitting on the side of the bed? : A Lot Difficulty sitting down on and standing up from a chair with arms (e.g., wheelchair, bedside commode, etc,.)?: Unable Help needed moving to and from a bed to chair (including a wheelchair)?: A Lot Help needed walking in hospital room?: A Lot Help needed climbing 3-5 steps with a railing? : A Lot 6 Click Score: 11    End of Session Equipment Utilized During Treatment: (Sling) Activity Tolerance: Other (comment)(Limited by nausea and dizziness) Patient left: in chair;with call bell/phone within reach Nurse Communication: Mobility status PT Visit Diagnosis: Unsteadiness on feet (R26.81);Other abnormalities of gait and mobility (R26.89);Pain Pain - Right/Left: Right Pain - part of body: Shoulder    Time: 1432-1500 PT Time Calculation (min) (ACUTE ONLY): 28 min   Charges:   PT Evaluation $PT Eval Low Complexity: 1 Low PT Treatments $Therapeutic Activity: 8-22 mins   PT G Codes:        Roney Marion, PT  Acute Rehabilitation Services Pager [phone] Office [place] 05/17/2017, 4:42 PM

## 2017-05-18 ENCOUNTER — Encounter (HOSPITAL_COMMUNITY): Payer: Self-pay

## 2017-05-18 LAB — BASIC METABOLIC PANEL
ANION GAP: 8 (ref 5–15)
BUN: 17 mg/dL (ref 6–20)
CO2: 27 mmol/L (ref 22–32)
Calcium: 8.9 mg/dL (ref 8.9–10.3)
Chloride: 99 mmol/L — ABNORMAL LOW (ref 101–111)
Creatinine, Ser: 0.69 mg/dL (ref 0.44–1.00)
GFR calc Af Amer: >60 mL/min (ref >60)
GLUCOSE: 129 mg/dL — AB (ref 65–99)
POTASSIUM: 3.7 mmol/L (ref 3.5–5.1)
Sodium: 134 mmol/L — ABNORMAL LOW (ref 135–145)

## 2017-05-18 LAB — CBC
HEMATOCRIT: 33 % — AB (ref 36.0–46.0)
Hemoglobin: 10.8 g/dL — ABNORMAL LOW (ref 12.0–15.0)
MCH: 32 pg (ref 26.0–34.0)
MCHC: 32.7 g/dL (ref 30.0–36.0)
MCV: 97.6 fL (ref 78.0–100.0)
PLATELETS: 258 10*3/uL (ref 150–400)
RBC: 3.38 MIL/uL — AB (ref 3.87–5.11)
RDW: 13.5 % (ref 11.5–15.5)
WBC: 12.7 10*3/uL — AB (ref 4.0–10.5)

## 2017-05-18 MED ORDER — METOPROLOL TARTRATE 5 MG/5ML IV SOLN
5.0000 mg | Freq: Once | INTRAVENOUS | Status: AC
Start: 2017-05-18 — End: 2017-05-18
  Administered 2017-05-18: 5 mg via INTRAVENOUS
  Filled 2017-05-18: qty 5

## 2017-05-18 MED ORDER — METOPROLOL SUCCINATE ER 25 MG PO TB24: 25.0000 mg | ORAL_TABLET | Freq: Every day | ORAL | Status: DC | PRN

## 2017-05-18 NOTE — H&P (View-Only) (Signed)
ORTHOPAEDIC CONSULTATION  REQUESTING PHYSICIAN: Katha Hamming   Chief Complaint: R shoulder pain  HPI: Lori Krueger is a 83 y.o. female who complains of  Right shoulder pain after a fall.  She was admitted for concern of pulmonary infiltrates from the ER and Dr. Doreatha Martin requested consultation to evaluate the shoulder.  Patient has had no previous history of trouble with the right shoulder.  She is living independently and relatively healthy.  She has no numbness and tingling in the extremity.  She is accompanied by her son.     Past Medical History:  Diagnosis Date  . Dyslipidemia   . Hypothyroidism   . Osteoporosis   . Skin cancer   . SVT (supraventricular tachycardia) (HCC)    Past Surgical History:  Procedure Laterality Date  . BUNIONECTOMY    . Hammer toe repair     Social History   Socioeconomic History  . Marital status: Widowed    Spouse name: None  . Number of children: None  . Years of education: None  . Highest education level: None  Social Needs  . Financial resource strain: None  . Food insecurity - worry: None  . Food insecurity - inability: None  . Transportation needs - medical: None  . Transportation needs - non-medical: None  Occupational History  . 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 Smoking status: Never Smoker  . Smokeless tobacco: Never Used  Substance and Sexual Activity  . Alcohol use: No  . Drug use: No  . Sexual activity: None  Other Topics Concern  . None  Social History Narrative   Lives alone.  Retired Network engineer for [employer].   Family History  Problem Relation Age of Onset  . Stroke Mother 43  . Alcohol abuse Father 68   Allergies  Allergen Reactions  . Codeine Nausea And Vomiting  . Lactose Intolerance (Gi) Diarrhea   Prior to Admission medications   Medication Sig Start Date End Date Taking? Authorizing Provider  acetaminophen (TYLENOL) 500 MG tablet Take 500 mg by mouth every 6 (six)  hours as needed (for pain).   Yes [provider]  aspirin EC 81 MG tablet Take 81 mg by mouth daily.   Yes [provider]  Cholecalciferol (VITAMIN D-3) 1000 units CAPS Take 1,000 Units by mouth daily.   Yes [provider]  levothyroxine (SYNTHROID, LEVOTHROID) 100 MCG tablet Take 100 mcg by mouth See admin instructions. 100 mcg in the morning on Mon/Tues/Wed/Thurs/Fri/Sat, skip Sundays   Yes [provider]  metoprolol succinate (TOPROL-XL) 25 MG 24 hr tablet Take 25 mg by mouth daily.   Yes [provider]  Multiple Vitamins-Minerals (MULTIVITAMINS THER. W/MINERALS) TABS Take 1 tablet by mouth daily.   Yes [provider]  VESICARE 5 MG tablet Take 1 tablet by mouth daily. 09/09/15  Yes [provider]   Dg Shoulder Right  Result Date: 05/16/2017 CLINICAL DATA:  [place] on RIGHT side, severe RIGHT shoulder pain, pain with palpation EXAM: RIGHT SHOULDER - 2+ VIEW COMPARISON:  None FINDINGS: Osseous demineralization. AC joint alignment normal. Fracture dislocation of the RIGHT humeral head/neck including a displaced greater tuberosity fragment. Inferior dislocation of the RIGHT humeral head. Visualized ribs intact. No additional fractures identified. IMPRESSION: Comminuted fracture of the proximal RIGHT humerus with inferior RIGHT glenohumeral dislocation. Electronically Signed   By: Lavonia Dana M.D.   On: 05/16/2017 12:55   Ct Head Wo Contrast  Result  Date: 05/16/2017 CLINICAL DATA:  Fall, right eye laceration EXAM: CT HEAD WITHOUT CONTRAST TECHNIQUE: Contiguous axial images were obtained from the base of the skull through the vertex without intravenous contrast. COMPARISON:  08/16/2016 FINDINGS: Brain: No evidence of acute infarction, hemorrhage, hydrocephalus, extra-axial collection or mass lesion/mass effect. Mild subcortical white matter and periventricular small vessel ischemic changes. Vascular: No hyperdense vessel or unexpected  calcification. Skull: Normal. Negative for fracture or focal lesion. Sinuses/Orbits: The visualized paranasal sinuses are essentially clear. The mastoid air cells are unopacified. Other: Mild soft tissue swelling/hematoma along the lateral aspect of the right orbit/zygoma (series 3/ image 12). IMPRESSION: Mild soft swelling/hematoma along the lateral aspect of the right orbit/ zygoma. No evidence of acute intracranial abnormality. Mild small vessel ischemic changes. Electronically Signed   By: Sriyesh  Krishnan M.D.   On: 05/16/2017 14:56   Ct Shoulder Right Wo Contrast  Result Date: 05/16/2017 CLINICAL DATA:  Right shoulder fracture. EXAM: CT OF THE UPPER RIGHT EXTREMITY WITHOUT CONTRAST TECHNIQUE: Multidetector CT imaging of the upper right extremity was performed according to the standard protocol. COMPARISON:  None. FINDINGS: Bones/Joint/Cartilage Acute comminuted fracture of the surgical neck of the right proximal humerus. Comminuted fracture of the superior lateral humeral head involving the greater and lesser tuberosities. Greater tuberosity is peripherally displaced 19 mm. Two fracture fragments are rotated approximately 90 degrees. Humeral head is anterior and inferiorly dislocated relative to the glenoid. Large amount of fluid in the subacromial/subdeltoid bursa with high density material dependently likely reflecting hemorrhagic bursitis. Moderate joint effusion. No other acute fracture or dislocation. No joint effusion. Ligaments Ligaments are suboptimally evaluated by CT. Muscles and Tendons No muscle atrophy. Soft tissue No fluid collection or hematoma. No soft tissue mass. Left lower lobe airspace disease may reflect atelectasis versus pneumonia. IMPRESSION: 1. Comminuted and displaced fracture of the surgical neck and head of the humerus. Anterior inferior dislocation of the humeral head relative to the glenoid. Large subacromial/subdeltoid hemorrhagic bursitis. 2. Right lower lobe airspace  disease which may reflect atelectasis versus pneumonia. Electronically Signed   By: Hetal  Patel   On: 05/16/2017 17:59   Dg Chest Port 1 View  Result Date: 05/16/2017 CLINICAL DATA:  Pt states she has had a dry cough at night for the past couple of days after a cold. Unable to remove bra due to pt being immobilized in sling for right shoulder. Pt states "she has had all the pain she can take." EXAM: PORTABLE CHEST 1 VIEW COMPARISON:  05/16/2017, 04/19/2014 FINDINGS: Shallow lung inflation. Heart size is accentuated by low lung volumes. There are no focal consolidations or pleural effusions. No pulmonary edema. There is minimal bibasilar atelectasis. Comminuted, displaced right shoulder fracture. IMPRESSION: No evidence for acute cardiopulmonary abnormality. Electronically Signed   By: Elizabeth  Brown M.D.   On: 05/16/2017 20:03   Dg Shoulder Right Portable  Result Date: 05/16/2017 CLINICAL DATA:  Post reduction. EXAM: PORTABLE RIGHT SHOULDER COMPARISON:  May 16, 2017 FINDINGS: A comminuted fracture of the right humeral head/neck persists. There is increased displacement of the humeral head. The humeral head remains dislocated anteriorly and inferiorly. No other changes. IMPRESSION: Comminuted fracture of the proximal right humerus as above with increased displacement of the humeral head. Continued dislocation of the humeral head. Electronically Signed   By: David  Williams III M.D   On: 05/16/2017 16:19   Family History Reviewed and non-contributory, no pertinent history of problems with bleeding or anesthesia      Review of Systems 14   system ROS conducted and negative except for that noted in HPI   OBJECTIVE  Vitals: Patient Vitals for the past 8 hrs:  BP Temp Temp src Pulse Resp SpO2  05/18/17 0543 (!) 109/51 98 F (36.7 C) Oral 73 16 94 %   General: Alert, no acute distress Cardiovascular: No pedal edema Respiratory: No cyanosis, no use of accessory musculature GI: No organomegaly,  abdomen is soft and non-tender Skin: No lesions in the area of chief complaint other than those listed below in MSK exam.  Neurologic: Sensation intact distally save for the below mentioned MSK exam Psychiatric: Patient is competent for consent with normal mood and affect Lymphatic: No axillary or cervical lymphadenopathy Extremities  LUE: full and painless ROM throughout hand with DPC of 0. ROM deferred shoulder and elbow in sling.  + Motor in  AIN, PIN, Ulnar distributions. Axillary nerve sensation and deltoid function preserved.  Sensation intact in medial, radial, and ulnar distributions. Well perfused digits.    Test Results Imaging Comminuted fracture dislocation of the proximal humerus with head positioned medial and inferior to glenoid on CT and XR  Labs cbc Recent Labs    05/16/17 1359 05/16/17 1854  WBC  --  14.7*  HGB 12.9 12.1  HCT 38.0 37.2  PLT  --  294    Labs inflam No results for input(s): CRP in the last 72 hours.  Invalid input(s): ESR  Labs coag No results for input(s): INR, PTT in the last 72 hours.  Invalid input(s): PT  Recent Labs    05/16/17 1359 05/16/17 1854  NA 140 135  K 4.0 4.5  CL 100* 99*  CO2  --  25  GLUCOSE 164* 145*  BUN 19 17  CREATININE 0.70 0.64  CALCIUM  --  8.8*     ASSESSMENT AND PLAN: 82 y.o. female with the following: R comminuted 4 part fracture dislocation  Discussed options at length.  Non operative measures are not recommended as the outcomes will be predictably poor.  The risks benefits and alternatives were discussed with the patient including but not limited to the risks of nonoperative treatment, versus surgical intervention including infection, bleeding, nerve injury, periprosthetic fracture, the need for revision surgery, dislocation, cardiopulmonary complications, morbidity, mortality, among others, and they were willing to proceed.  Plan for Reverse total shoulder arthroplasty 05/19/17.    Likely discharge to  SNF vs home on 05/20/17.  Discussed this with family.  NPO at midnight, hold anticoagulation.  Will post. 

## 2017-05-18 NOTE — Progress Notes (Signed)
Orthopaedic Trauma Progress Note  S: Patient seen this AM, doing well, pain better.  O:  Vitals:   05/18/17 1231 05/18/17 1302  BP: 99/65   Pulse: (!) 163 75  Resp: 16   Temp: 98.1 F (36.7 C)   SpO2: 93%    RUE:in sling. Neurointact  A/P: 83 year old female with 4part proximal humerus fracture dislocation  -Plan for OR tomorrow with Dr. Griffin Basil -NPO past midnight  Shona Needles, MD [facility] [phone] (phone)

## 2017-05-18 NOTE — Evaluation (Signed)
Occupational Therapy Evaluation Patient Details Name: Lori Krueger MRN: [id number] DOB: 05-25-1934 Today's Date: 05/18/2017    History of Present Illness Presents to the ED after fall resulting in R humeral fx; plan is for Rev TSA on 1/9;  has a past medical history of Dyslipidemia, Hypothyroidism, Osteoporosis, Skin cancer, and SVT (supraventricular tachycardia) ([place]).   Clinical Impression   Pt admitted with the above diagnosis and has the deficits listed below. Pt would benefit from cont OT to increase independence with basic adls  after surgery tomorrow. New goals based on surgeon orders will be set in regards to RUE.  Pt lives alone so may need SNF rehab before returning home. Will reeval after surgery.    Follow Up Recommendations  SNF;Supervision/Assistance - 24 hour    Equipment Recommendations  3 in 1 bedside commode    Recommendations for Other Services       Precautions / Restrictions Precautions Precautions: Fall Required Braces or Orthoses: Sling Restrictions Weight Bearing Restrictions: Yes RUE Weight Bearing: Non weight bearing      Mobility Bed Mobility Overal bed mobility: Needs Assistance Bed Mobility: Supine to Sit     Supine to sit: Min guard     General bed mobility comments: got up to sitting on EOB via semi-roll onto R side; no physical assist needed  Transfers Overall transfer level: Needs assistance Equipment used: 1 person hand held assist Transfers: Sit to/from [facility] Sit to Stand: Min assist Stand pivot transfers: Min guard       General transfer comment: min assist to steady. Overall pt just feels weak and not steady on her feet.    Balance Overall balance assessment: Needs assistance Sitting-balance support: Feet supported Sitting balance-Leahy Scale: Good     Standing balance support: Single extremity supported;During functional activity Standing balance-Leahy Scale: Fair Standing balance comment: Pt could  stand for brief moments without support but at this point still requires hand held assist.                            ADL either performed or assessed with clinical judgement   ADL Overall ADL's : Needs assistance/impaired Eating/Feeding: Set up;Sitting Eating/Feeding Details (indicate cue type and reason): assist to cut food Grooming: Wash/dry hands;Wash/dry face;Oral care;Set up;Standing Grooming Details (indicate cue type and reason): Pt stood in bathroom to groom with occasional assist for set up. Upper Body Bathing: Minimal assistance;Sitting   Lower Body Bathing: Minimal assistance;Sit to/from stand;Cueing for compensatory techniques Lower Body Bathing Details (indicate cue type and reason): cuing of how she can and cannot use RUE to assist. Upper Body Dressing : Maximal assistance;Sitting Upper Body Dressing Details (indicate cue type and reason): need to teach pt dressing techniques after surgery. Lower Body Dressing: Minimal assistance;Sit to/from stand Lower Body Dressing Details (indicate cue type and reason): pt donned socks.  Assist required to pull pants up in standing with one hand. Toilet Transfer: Minimal assistance;BSC;Comfort height toilet;Ambulation Toilet Transfer Details (indicate cue type and reason): pt walked to bathroom Toileting- Clothing Manipulation and Hygiene: Min guard;Sitting/lateral lean       Functional mobility during ADLs: Minimal assistance General ADL Comments: Pt just feels very unsteady on her feet.  Will talk to PT about a cane??       Vision Baseline Vision/History: No visual deficits Patient Visual Report: No change from baseline Vision Assessment?: No apparent visual deficits     Perception     Praxis  Pertinent Vitals/Pain Pain Assessment: 0-10 Pain Score: 6  Pain Location: R shoulder Pain Descriptors / Indicators: Aching;Grimacing;Guarding Pain Intervention(s): Limited activity within patient's tolerance;Monitored  during session;Repositioned     Hand Dominance Left   Extremity/Trunk Assessment Upper Extremity Assessment Upper Extremity Assessment: RUE deficits/detail RUE Deficits / Details: elbow, wrist and fingers WFL.  R shoulder not tested due to fx with surgery scheduled for tomorrow. RUE: Unable to fully assess due to pain;Unable to fully assess due to immobilization RUE Coordination: decreased gross motor   Lower Extremity Assessment Lower Extremity Assessment: Defer to PT evaluation   Cervical / Trunk Assessment Cervical / Trunk Assessment: Kyphotic   Communication Communication Communication: No difficulties   Cognition Arousal/Alertness: Awake/alert Behavior During Therapy: WFL for tasks assessed/performed Overall Cognitive Status: Within Functional Limits for tasks assessed                                     General Comments  Pt with nausea.  Nursing notified.    Exercises     Shoulder Instructions      Home Living Family/patient expects to be discharged to:: Private residence Living Arrangements: Alone Available Help at Discharge: Family;Available PRN/intermittently Type of Home: House Home Access: Stairs to enter [employer] of Steps: 2             Bathroom Toilet: Standard                Prior Functioning/Environment Level of Independence: Independent        Comments: very active, independent, volunteers here        OT Problem List: Decreased strength;Decreased range of motion;Impaired balance (sitting and/or standing);Decreased knowledge of use of DME or AE;Decreased knowledge of precautions;Impaired UE functional use;Pain      OT Treatment/Interventions: Self-care/ADL training;DME and/or AE instruction;Therapeutic activities    OT Goals(Current goals can be found in the care plan section) Acute Rehab OT Goals Patient Stated Goal: recover and back to independence OT Goal Formulation: With patient Time For Goal  Achievement: 06/01/17 Potential to Achieve Goals: Good ADL Goals Pt Will Perform Upper Body Bathing: with supervision;sitting Pt Will Perform Upper Body Dressing: with supervision;sitting Pt Will Perform Lower Body Dressing: with supervision;sit to/from stand Additional ADL Goal #1: Pt will walk to bathroom and toilet with 3:1 over commode with S  OT Frequency: Min 2X/week   Barriers to D/C: Decreased caregiver support  pt lives alone       Co-evaluation              AM-PAC PT "6 Clicks" Daily Activity     Outcome Measure Help from another person eating meals?: A Little Help from another person taking care of personal grooming?: A Little Help from another person toileting, which includes using toliet, bedpan, or urinal?: A Little Help from another person bathing (including washing, rinsing, drying)?: A Little Help from another person to put on and taking off regular upper body clothing?: A Little Help from another person to put on and taking off regular lower body clothing?: A Little 6 Click Score: 18   End of Session Nurse Communication: Mobility status  Activity Tolerance: Patient tolerated treatment well Patient left: in chair;with call bell/phone within reach;with family/visitor present  OT Visit Diagnosis: Unsteadiness on feet (R26.81);History of falling (Z91.81);Dizziness and giddiness (R42);Pain Pain - Right/Left: Right Pain - part of body: Shoulder  Time: 3358-2518 OT Time Calculation (min): 25 min Charges:  OT General Charges $OT Visit: 1 Visit OT Evaluation $OT Eval Moderate Complexity: 1 Mod OT Treatments $Self Care/Home Management : 8-22 mins G-Codes:     Jinger Neighbors, OTR/L  Glenford Peers 05/18/2017, 10:52 AM

## 2017-05-18 NOTE — NC FL2 (Signed)
[place] MEDICAID [id number] LEVEL OF CARE SCREENING TOOL     IDENTIFICATION  Patient Name: Lori Krueger Birthdate: 05/15/1934 Sex: female Admission Date (Current Location): 05/16/2017  [facility] and [place] Number:  Herbalist and Address:  [facility] [place], [place] [address], [place], [place] [address]      Provider Number: [id number]  Attending Physician Name and Address:  Geradine Girt, DO  Relative Name and Phone Number:  Dorian Furnace, daughter, [phone]    Current Level of Care: Hospital Recommended Level of Care: [place] Prior Approval Number:    Date Approved/Denied:   PASRR Number: [id number]  Discharge Plan: SNF    Current Diagnoses: Patient Active Problem List   Diagnosis Date Noted  . Hypoxia 05/16/2017  . Chest pain at rest 04/23/2014  . SVT (supraventricular tachycardia) ([place]) 06/26/2011  . Hypertension 06/26/2011    Orientation RESPIRATION BLADDER Height & Weight     Self, Time, Situation, Place  Normal Continent Weight: 140 lb (63.5 kg) Height:  5\' 1"  (154.9 cm)  BEHAVIORAL SYMPTOMS/MOOD NEUROLOGICAL BOWEL NUTRITION STATUS      Continent Diet(See DC Summary)  AMBULATORY STATUS COMMUNICATION OF NEEDS Skin   Limited Assist Verbally Surgical wounds                       Personal Care Assistance Level of Assistance  Dressing, Feeding, Bathing Bathing Assistance: Limited assistance Feeding assistance: Limited assistance Dressing Assistance: Limited assistance     Functional Limitations Info  Speech, Hearing, Sight Sight Info: Adequate Hearing Info: Adequate Speech Info: Adequate    SPECIAL CARE FACTORS FREQUENCY  PT (By licensed PT), OT (By licensed OT)     PT Frequency: 5x week OT Frequency: 5x week            Contractures Contractures Info: Not present    Additional Factors Info  Code Status, Allergies Code Status Info: Full  Allergies Info: CODEINE, LACTOSE INTOLERANCE (GI)             Current Medications (05/18/2017):  This is the current hospital active medication list Current Facility-Administered Medications  Medication Dose Route Frequency Provider Last Rate Last Dose  . 0.9 %  sodium chloride infusion  250 mL Intravenous PRN Phillips Grout, MD      . levothyroxine (SYNTHROID, LEVOTHROID) tablet 100 mcg  100 mcg Oral Once per day on Mon Tue Wed Thu Fri Sat Phillips Grout, MD   100 mcg at 05/18/17 8099  . metoprolol succinate (TOPROL-XL) 24 hr tablet 25 mg  25 mg Oral Daily Derrill Kay A, MD   25 mg at 05/18/17 1102  . metoprolol succinate (TOPROL-XL) 24 hr tablet 25 mg  25 mg Oral Daily PRN Eulogio Bear U, DO      . ondansetron (ZOFRAN) injection 4 mg  4 mg Intravenous Q6H PRN Eulogio Bear U, DO   4 mg at 05/18/17 0248  . promethazine (PHENERGAN) injection 6.25 mg  6.25 mg Intravenous Q6H PRN Eliseo Squires, Jessica U, DO      . sodium chloride (OCEAN) 0.65 % nasal spray 1 spray  1 spray Each Nare PRN Haddix, Thomasene Lot, MD   1 spray at 05/17/17 2243  . sodium chloride flush (NS) 0.9 % injection 3 mL  3 mL Intravenous Q12H Derrill Kay A, MD   3 mL at 05/18/17 1103  . sodium chloride flush (NS) 0.9 % injection 3 mL  3 mL Intravenous PRN Shanon Brow,  Rachal A, MD      . traMADol (ULTRAM) tablet 50 mg  50 mg Oral Q6H PRN Eulogio Bear U, DO   50 mg at 05/18/17 1233     Discharge Medications: Please see discharge summary for a list of discharge medications.  Relevant Imaging Results:  Relevant Lab Results:   Additional Information SS#: [id number]  Bruno, LCSW

## 2017-05-18 NOTE — Progress Notes (Signed)
Patient received 5 mg Metoprolol IV, patient HR is 75

## 2017-05-18 NOTE — Consult Note (Signed)
ORTHOPAEDIC CONSULTATION  REQUESTING PHYSICIAN: Katha Hamming   Chief Complaint: R shoulder pain  HPI: Lori Krueger is a 83 y.o. female who complains of  Right shoulder pain after a fall.  She was admitted for concern of pulmonary infiltrates from the ER and Dr. Doreatha Martin requested consultation to evaluate the shoulder.  Patient has had no previous history of trouble with the right shoulder.  She is living independently and relatively healthy.  She has no numbness and tingling in the extremity.  She is accompanied by her son.     Past Medical History:  Diagnosis Date  . Dyslipidemia   . Hypothyroidism   . Osteoporosis   . Skin cancer   . SVT (supraventricular tachycardia) (HCC)    Past Surgical History:  Procedure Laterality Date  . BUNIONECTOMY    . Hammer toe repair     Social History   Socioeconomic History  . Marital status: Widowed    Spouse name: None  . Number of children: None  . Years of education: None  . Highest education level: None  Social Needs  . Financial resource strain: None  . Food insecurity - worry: None  . Food insecurity - inability: None  . Transportation needs - medical: None  . Transportation needs - non-medical: None  Occupational History  . 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 Smoking status: Never Smoker  . Smokeless tobacco: Never Used  Substance and Sexual Activity  . Alcohol use: No  . Drug use: No  . Sexual activity: None  Other Topics Concern  . None  Social History Narrative   Lives alone.  Retired Network engineer for [employer].   Family History  Problem Relation Age of Onset  . Stroke Mother 43  . Alcohol abuse Father 68   Allergies  Allergen Reactions  . Codeine Nausea And Vomiting  . Lactose Intolerance (Gi) Diarrhea   Prior to Admission medications   Medication Sig Start Date End Date Taking? Authorizing Provider  acetaminophen (TYLENOL) 500 MG tablet Take 500 mg by mouth every 6 (six)  hours as needed (for pain).   Yes [provider]  aspirin EC 81 MG tablet Take 81 mg by mouth daily.   Yes [provider]  Cholecalciferol (VITAMIN D-3) 1000 units CAPS Take 1,000 Units by mouth daily.   Yes [provider]  levothyroxine (SYNTHROID, LEVOTHROID) 100 MCG tablet Take 100 mcg by mouth See admin instructions. 100 mcg in the morning on Mon/Tues/Wed/Thurs/Fri/Sat, skip Sundays   Yes [provider]  metoprolol succinate (TOPROL-XL) 25 MG 24 hr tablet Take 25 mg by mouth daily.   Yes [provider]  Multiple Vitamins-Minerals (MULTIVITAMINS THER. W/MINERALS) TABS Take 1 tablet by mouth daily.   Yes [provider]  VESICARE 5 MG tablet Take 1 tablet by mouth daily. 09/09/15  Yes [provider]   Dg Shoulder Right  Result Date: 05/16/2017 CLINICAL DATA:  [place] [address] on RIGHT side, severe RIGHT shoulder pain, pain with palpation EXAM: RIGHT SHOULDER - 2+ VIEW COMPARISON:  None FINDINGS: Osseous demineralization. AC joint alignment normal. Fracture dislocation of the RIGHT humeral head/neck including a displaced greater tuberosity fragment. Inferior dislocation of the RIGHT humeral head. Visualized ribs intact. No additional fractures identified. IMPRESSION: Comminuted fracture of the proximal RIGHT humerus with inferior RIGHT glenohumeral dislocation. Electronically Signed   By: Lavonia Dana M.D.   On: 05/16/2017 12:55   Ct Head Wo Contrast  Result  Date: 05/16/2017 CLINICAL DATA:  Fall, right eye laceration EXAM: CT HEAD WITHOUT CONTRAST TECHNIQUE: Contiguous axial images were obtained from the base of the skull through the vertex without intravenous contrast. COMPARISON:  08/16/2016 FINDINGS: Brain: No evidence of acute infarction, hemorrhage, hydrocephalus, extra-axial collection or mass lesion/mass effect. Mild subcortical white matter and periventricular small vessel ischemic changes. Vascular: No hyperdense vessel or unexpected  calcification. Skull: Normal. Negative for fracture or focal lesion. Sinuses/Orbits: The visualized paranasal sinuses are essentially clear. The mastoid air cells are unopacified. Other: Mild soft tissue swelling/hematoma along the lateral aspect of the right orbit/zygoma (series 3/ image 12). IMPRESSION: Mild soft swelling/hematoma along the lateral aspect of the right orbit/ zygoma. No evidence of acute intracranial abnormality. Mild small vessel ischemic changes. Electronically Signed   By: Julian Hy M.D.   On: 05/16/2017 14:56   Ct Shoulder Right Wo Contrast  Result Date: 05/16/2017 CLINICAL DATA:  Right shoulder fracture. EXAM: CT OF THE UPPER RIGHT EXTREMITY WITHOUT CONTRAST TECHNIQUE: Multidetector CT imaging of the upper right extremity was performed according to the standard protocol. COMPARISON:  None. FINDINGS: Bones/Joint/Cartilage Acute comminuted fracture of the surgical neck of the right proximal humerus. Comminuted fracture of the superior lateral humeral head involving the greater and lesser tuberosities. Greater tuberosity is peripherally displaced 19 mm. Two fracture fragments are rotated approximately 90 degrees. Humeral head is anterior and inferiorly dislocated relative to the glenoid. Large amount of fluid in the subacromial/subdeltoid bursa with high density material dependently likely reflecting hemorrhagic bursitis. Moderate joint effusion. No other acute fracture or dislocation. No joint effusion. Ligaments Ligaments are suboptimally evaluated by CT. Muscles and Tendons No muscle atrophy. Soft tissue No fluid collection or hematoma. No soft tissue mass. Left lower lobe airspace disease may reflect atelectasis versus pneumonia. IMPRESSION: 1. Comminuted and displaced fracture of the surgical neck and head of the humerus. Anterior inferior dislocation of the humeral head relative to the glenoid. Large subacromial/subdeltoid hemorrhagic bursitis. 2. Right lower lobe airspace  disease which may reflect atelectasis versus pneumonia. Electronically Signed   By: Kathreen Devoid   On: 05/16/2017 17:59   Dg Chest Port 1 View  Result Date: 05/16/2017 CLINICAL DATA:  Pt states she has had a dry cough at night for the past couple of days after a cold. Unable to remove bra due to pt being immobilized in sling for right shoulder. Pt states "she has had all the pain she can take." EXAM: PORTABLE CHEST 1 VIEW COMPARISON:  05/16/2017, 04/19/2014 FINDINGS: Shallow lung inflation. Heart size is accentuated by low lung volumes. There are no focal consolidations or pleural effusions. No pulmonary edema. There is minimal bibasilar atelectasis. Comminuted, displaced right shoulder fracture. IMPRESSION: No evidence for acute cardiopulmonary abnormality. Electronically Signed   By: Nolon Nations M.D.   On: 05/16/2017 20:03   Dg Shoulder Right Portable  Result Date: 05/16/2017 CLINICAL DATA:  Post reduction. EXAM: PORTABLE RIGHT SHOULDER COMPARISON:  May 16, 2017 FINDINGS: A comminuted fracture of the right humeral head/neck persists. There is increased displacement of the humeral head. The humeral head remains dislocated anteriorly and inferiorly. No other changes. IMPRESSION: Comminuted fracture of the proximal right humerus as above with increased displacement of the humeral head. Continued dislocation of the humeral head. Electronically Signed   By: Dorise Bullion III M.D   On: 05/16/2017 16:19   Family History Reviewed and non-contributory, no pertinent history of problems with bleeding or anesthesia      Review of Systems 14  system ROS conducted and negative except for that noted in HPI   OBJECTIVE  Vitals: Patient Vitals for the past 8 hrs:  BP Temp Temp src Pulse Resp SpO2  05/18/17 0543 (!) 109/51 98 F (36.7 C) Oral 73 16 94 %   General: Alert, no acute distress Cardiovascular: No pedal edema Respiratory: No cyanosis, no use of accessory musculature GI: No organomegaly,  abdomen is soft and non-tender Skin: No lesions in the area of chief complaint other than those listed below in MSK exam.  Neurologic: Sensation intact distally save for the below mentioned MSK exam Psychiatric: Patient is competent for consent with normal mood and affect Lymphatic: No axillary or cervical lymphadenopathy Extremities  LUE: full and painless ROM throughout hand with DPC of 0. ROM deferred shoulder and elbow in sling.  + Motor in  AIN, PIN, Ulnar distributions. Axillary nerve sensation and deltoid function preserved.  Sensation intact in medial, radial, and ulnar distributions. Well perfused digits.    Test Results Imaging Comminuted fracture dislocation of the proximal humerus with head positioned medial and inferior to glenoid on CT and XR  Labs cbc Recent Labs    05/16/17 1359 05/16/17 1854  WBC  --  14.7*  HGB 12.9 12.1  HCT 38.0 37.2  PLT  --  294    Labs inflam No results for input(s): CRP in the last 72 hours.  Invalid input(s): ESR  Labs coag No results for input(s): INR, PTT in the last 72 hours.  Invalid input(s): PT  Recent Labs    05/16/17 1359 05/16/17 1854  NA 140 135  K 4.0 4.5  CL 100* 99*  CO2  --  25  GLUCOSE 164* 145*  BUN 19 17  CREATININE 0.70 0.64  CALCIUM  --  8.8*     ASSESSMENT AND PLAN: 83 y.o. female with the following: R comminuted 4 part fracture dislocation  Discussed options at length.  Non operative measures are not recommended as the outcomes will be predictably poor.  The risks benefits and alternatives were discussed with the patient including but not limited to the risks of nonoperative treatment, versus surgical intervention including infection, bleeding, nerve injury, periprosthetic fracture, the need for revision surgery, dislocation, cardiopulmonary complications, morbidity, mortality, among others, and they were willing to proceed.  Plan for Reverse total shoulder arthroplasty 05/19/17.    Likely discharge to  SNF vs home on 05/20/17.  Discussed this with family.  NPO at midnight, hold anticoagulation.  Will post.

## 2017-05-18 NOTE — Progress Notes (Signed)
Called in to patients room. Patient stating she feels like her heart is racing.  Hx of SVT. Metoprolol 25 mg given at 1102. Apical pulse 160.  Dinamap monitor pulse 163. Paged Dr. Eliseo Squires.  Stated to have patient perform valsalva maneuver. Orders to be placed for IV metoprolol.  Patient stated she typically does not drink caffeine but has had some during hospital stay.  Also said that she has had multiple visitors and pain in arm from fall which she states could have contributed as well.  NAD.

## 2017-05-18 NOTE — Progress Notes (Signed)
PROGRESS NOTE    Lori Krueger  HWE:[id number] DOB: 01-17-1935 DOA: 05/16/2017 PCP: Haywood Pao, MD   Outpatient Specialists:     Brief Narrative:  Lori Krueger is a 83 y.o. female with medical history significant of HTN comes in after tripping and falling and injuring her right shoulder.  She had no LOC.  She lives alone and is very active.  Shoulder was attempted to be reduced in ED unsuccessfully.  Pt got hypoxic after sedation with now normal oxygen sats at 100% during my interview.  Ortho was called who advised ct scan and then obs overnight.  Pt does report a uri and cough for the last week or so.  No fevers.  No le edema or swelling.  No chest pain.     Assessment & Plan:   Principal Problem:   Hypertension Active Problems:   Hypoxia   Hypertension -continue home meds    Right humeral displaced fracture -plan for surgery on Wednesday -pain control with tramadol -ambulate  Leukocytosis -suspect reactive -recheck CBC in AM  Nausea- from pain medications -PRN zofran/phenergan    Hypoxia  - resolved-- suspect related to sedating medications that patient received in the ED -No infiltrate on cxr and procalcitonin negative--  No abx -Incentive spirometry  Ortho offer to take over after surgery appreciated.    DVT prophylaxis:  SCD's  Code Status: Full Code   Family Communication: At bedside  Disposition Plan:  Plan for surgery on Wednesday   Consultants:   ortho    Subjective: Less nausea  Objective: Vitals:   05/17/17 1455 05/17/17 2136 05/18/17 0543 05/18/17 1100  BP: (!) 121/59 131/61 (!) 109/51 (!) 135/56  Pulse: 71 78 73 70  Resp: 16 16 16 16   Temp:  98.9 F (37.2 C) 98 F (36.7 C)   TempSrc:  Oral Oral   SpO2: 98% 98% 94%   Weight:      Height:        Intake/Output Summary (Last 24 hours) at 05/18/2017 1227 Last data filed at 05/18/2017 0945 Gross per 24 hour  Intake 476 ml  Output 700 ml  Net -224 ml   Filed Weights     05/16/17 1224 05/17/17 0000  Weight: 63.5 kg (140 lb) 63.5 kg (140 lb)    Examination:  General exam: in chair, NAD Respiratory system: no wheezing Cardiovascular system: rrr Gastrointestinal system: +BS, soft Central nervous system: A+Ox3 Extremities: arm in sling- right Skin: bruising on right eye Psychiatry: normal mood-- good eye contact    Data Reviewed: I have personally reviewed following labs and imaging studies  CBC: Recent Labs  Lab 05/16/17 1359 05/16/17 1854 05/18/17 0642  WBC  --  14.7* 12.7*  NEUTROABS  --  13.6*  --   HGB 12.9 12.1 10.8*  HCT 38.0 37.2 33.0*  MCV  --  97.4 97.6  PLT  --  294 893   Basic Metabolic Panel: Recent Labs  Lab 05/16/17 1359 05/16/17 1854 05/18/17 0642  NA 140 135 134*  K 4.0 4.5 3.7  CL 100* 99* 99*  CO2  --  25 27  GLUCOSE 164* 145* 129*  BUN 19 17 17   CREATININE 0.70 0.64 0.69  CALCIUM  --  8.8* 8.9   GFR: Estimated Creatinine Clearance: 47.9 mL/min (by C-G formula based on SCr of 0.69 mg/dL). Liver Function Tests: No results for input(s): AST, ALT, ALKPHOS, BILITOT, PROT, ALBUMIN in the last 168 hours. No results for input(s): LIPASE, AMYLASE in the  last 168 hours. No results for input(s): AMMONIA in the last 168 hours. Coagulation Profile: No results for input(s): INR, PROTIME in the last 168 hours. Cardiac Enzymes: No results for input(s): CKTOTAL, CKMB, CKMBINDEX, TROPONINI in the last 168 hours. BNP (last 3 results) No results for input(s): PROBNP in the last 8760 hours. HbA1C: No results for input(s): HGBA1C in the last 72 hours. CBG: No results for input(s): GLUCAP in the last 168 hours. Lipid Profile: No results for input(s): CHOL, HDL, LDLCALC, TRIG, CHOLHDL, LDLDIRECT in the last 72 hours. Thyroid Function Tests: No results for input(s): TSH, T4TOTAL, FREET4, T3FREE, THYROIDAB in the last 72 hours. Anemia Panel: No results for input(s): VITAMINB12, FOLATE, FERRITIN, TIBC, IRON, RETICCTPCT in  the last 72 hours. Urine analysis: No results found for: COLORURINE, APPEARANCEUR, LABSPEC, PHURINE, GLUCOSEU, HGBUR, BILIRUBINUR, KETONESUR, PROTEINUR, UROBILINOGEN, NITRITE, LEUKOCYTESUR   ) Recent Results (from the past 240 hour(s))  Surgical PCR screen     Status: None   Collection Time: 05/17/17  1:10 AM  Result Value Ref Range Status   MRSA, PCR NEGATIVE NEGATIVE Final   Staphylococcus aureus NEGATIVE NEGATIVE Final    Comment: (NOTE) The Xpert SA Assay (FDA approved for NASAL specimens in patients 19 years of age and older), is one component of a comprehensive surveillance program. It is not intended to diagnose infection nor to guide or monitor treatment.       Anti-infectives (From admission, onward)   Start     Dose/Rate Route Frequency Ordered Stop   05/16/17 1900  levofloxacin (LEVAQUIN) IVPB 500 mg     500 mg 100 mL/hr over 60 Minutes Intravenous  Once 05/16/17 1826 05/16/17 2003       Radiology Studies: Dg Shoulder Right  Result Date: 05/16/2017 CLINICAL DATA:  [place] on RIGHT side, severe RIGHT shoulder pain, pain with palpation EXAM: RIGHT SHOULDER - 2+ VIEW COMPARISON:  None FINDINGS: Osseous demineralization. AC joint alignment normal. Fracture dislocation of the RIGHT humeral head/neck including a displaced greater tuberosity fragment. Inferior dislocation of the RIGHT humeral head. Visualized ribs intact. No additional fractures identified. IMPRESSION: Comminuted fracture of the proximal RIGHT humerus with inferior RIGHT glenohumeral dislocation. Electronically Signed   By: Lavonia Dana M.D.   On: 05/16/2017 12:55   Ct Head Wo Contrast  Result Date: 05/16/2017 CLINICAL DATA:  Fall, right eye laceration EXAM: CT HEAD WITHOUT CONTRAST TECHNIQUE: Contiguous axial images were obtained from the base of the skull through the vertex without intravenous contrast. COMPARISON:  08/16/2016 FINDINGS: Brain: No evidence of acute infarction, hemorrhage, hydrocephalus,  extra-axial collection or mass lesion/mass effect. Mild subcortical white matter and periventricular small vessel ischemic changes. Vascular: No hyperdense vessel or unexpected calcification. Skull: Normal. Negative for fracture or focal lesion. Sinuses/Orbits: The visualized paranasal sinuses are essentially clear. The mastoid air cells are unopacified. Other: Mild soft tissue swelling/hematoma along the lateral aspect of the right orbit/zygoma (series 3/ image 12). IMPRESSION: Mild soft swelling/hematoma along the lateral aspect of the right orbit/ zygoma. No evidence of acute intracranial abnormality. Mild small vessel ischemic changes. Electronically Signed   By: Julian Hy M.D.   On: 05/16/2017 14:56   Ct Shoulder Right Wo Contrast  Result Date: 05/16/2017 CLINICAL DATA:  Right shoulder fracture. EXAM: CT OF THE UPPER RIGHT EXTREMITY WITHOUT CONTRAST TECHNIQUE: Multidetector CT imaging of the upper right extremity was performed according to the standard protocol. COMPARISON:  None. FINDINGS: Bones/Joint/Cartilage Acute comminuted fracture of the surgical neck of the right proximal humerus. Comminuted  fracture of the superior lateral humeral head involving the greater and lesser tuberosities. Greater tuberosity is peripherally displaced 19 mm. Two fracture fragments are rotated approximately 90 degrees. Humeral head is anterior and inferiorly dislocated relative to the glenoid. Large amount of fluid in the subacromial/subdeltoid bursa with high density material dependently likely reflecting hemorrhagic bursitis. Moderate joint effusion. No other acute fracture or dislocation. No joint effusion. Ligaments Ligaments are suboptimally evaluated by CT. Muscles and Tendons No muscle atrophy. Soft tissue No fluid collection or hematoma. No soft tissue mass. Left lower lobe airspace disease may reflect atelectasis versus pneumonia. IMPRESSION: 1. Comminuted and displaced fracture of the surgical neck and head  of the humerus. Anterior inferior dislocation of the humeral head relative to the glenoid. Large subacromial/subdeltoid hemorrhagic bursitis. 2. Right lower lobe airspace disease which may reflect atelectasis versus pneumonia. Electronically Signed   By: Kathreen Devoid   On: 05/16/2017 17:59   Dg Chest Port 1 View  Result Date: 05/16/2017 CLINICAL DATA:  Pt states she has had a dry cough at night for the past couple of days after a cold. Unable to remove bra due to pt being immobilized in sling for right shoulder. Pt states "she has had all the pain she can take." EXAM: PORTABLE CHEST 1 VIEW COMPARISON:  05/16/2017, 04/19/2014 FINDINGS: Shallow lung inflation. Heart size is accentuated by low lung volumes. There are no focal consolidations or pleural effusions. No pulmonary edema. There is minimal bibasilar atelectasis. Comminuted, displaced right shoulder fracture. IMPRESSION: No evidence for acute cardiopulmonary abnormality. Electronically Signed   By: Nolon Nations M.D.   On: 05/16/2017 20:03   Dg Shoulder Right Portable  Result Date: 05/16/2017 CLINICAL DATA:  Post reduction. EXAM: PORTABLE RIGHT SHOULDER COMPARISON:  May 16, 2017 FINDINGS: A comminuted fracture of the right humeral head/neck persists. There is increased displacement of the humeral head. The humeral head remains dislocated anteriorly and inferiorly. No other changes. IMPRESSION: Comminuted fracture of the proximal right humerus as above with increased displacement of the humeral head. Continued dislocation of the humeral head. Electronically Signed   By: Dorise Bullion III M.D   On: 05/16/2017 16:19        Scheduled Meds: . levothyroxine  100 mcg Oral Once per day on Mon Tue Wed Thu Fri Sat  . metoprolol succinate  25 mg Oral Daily  . sodium chloride flush  3 mL Intravenous Q12H   Continuous Infusions: . sodium chloride       LOS: 1 day    Time spent: 25 min    Geradine Girt, DO [facility] Pager  [phone]  If 7PM-7AM, please contact night-coverage [email or web address] Password [facility] 05/18/2017, 12:27 PM

## 2017-05-19 ENCOUNTER — Inpatient Hospital Stay (HOSPITAL_COMMUNITY): Payer: Medicare Other | Admitting: Critical Care Medicine

## 2017-05-19 ENCOUNTER — Encounter (HOSPITAL_COMMUNITY): Payer: Self-pay | Admitting: Critical Care Medicine

## 2017-05-19 ENCOUNTER — Inpatient Hospital Stay (HOSPITAL_COMMUNITY): Payer: Medicare Other

## 2017-05-19 ENCOUNTER — Encounter (HOSPITAL_COMMUNITY)
Admission: EM | Disposition: A | Discharge status: Skilled Nursing Facility | Payer: Self-pay | Source: Home / Self Care | Attending: Internal Medicine

## 2017-05-19 HISTORY — PX: REVERSE SHOULDER ARTHROPLASTY: SHX5054

## 2017-05-19 SURGERY — ARTHROPLASTY, SHOULDER, TOTAL, REVERSE
Anesthesia: General | Laterality: Right

## 2017-05-19 MED ORDER — ONDANSETRON HCL 4 MG/2ML IJ SOLN
INTRAMUSCULAR | Status: DC | PRN
  Administered 2017-05-19: 4 mg via INTRAVENOUS

## 2017-05-19 MED ORDER — 0.9 % SODIUM CHLORIDE (POUR BTL) OPTIME
TOPICAL | Status: DC | PRN
  Administered 2017-05-19: 1000 mL

## 2017-05-19 MED ORDER — FENTANYL CITRATE (PF) 250 MCG/5ML IJ SOLN
INTRAMUSCULAR | Status: AC
  Filled 2017-05-19: qty 5

## 2017-05-19 MED ORDER — FENTANYL CITRATE (PF) 100 MCG/2ML IJ SOLN: 25.0000 ug | INTRAMUSCULAR | Status: DC | PRN

## 2017-05-19 MED ORDER — HYDROMORPHONE HCL 2 MG PO TABS: 1.0000 mg | ORAL_TABLET | ORAL | Status: DC | PRN

## 2017-05-19 MED ORDER — SODIUM CHLORIDE 0.9 % IR SOLN
Status: DC | PRN
  Administered 2017-05-19: 3000 mL

## 2017-05-19 MED ORDER — LIDOCAINE 2% (20 MG/ML) 5 ML SYRINGE
INTRAMUSCULAR | Status: AC
  Filled 2017-05-19: qty 5

## 2017-05-19 MED ORDER — PROPOFOL 10 MG/ML IV BOLUS
INTRAVENOUS | Status: DC | PRN
  Administered 2017-05-19: 80 mg via INTRAVENOUS

## 2017-05-19 MED ORDER — CELECOXIB 200 MG PO CAPS
200.0000 mg | ORAL_CAPSULE | Freq: Two times a day (BID) | ORAL | Status: DC
  Administered 2017-05-19 – 2017-05-20 (×2): 200 mg via ORAL
  Filled 2017-05-19 (×2): qty 1

## 2017-05-19 MED ORDER — ACETAMINOPHEN 500 MG PO TABS
1000.0000 mg | ORAL_TABLET | Freq: Four times a day (QID) | ORAL | Status: AC
  Administered 2017-05-19 – 2017-05-20 (×3): 1000 mg via ORAL
  Filled 2017-05-19 (×3): qty 2

## 2017-05-19 MED ORDER — LACTASE 3000 UNITS PO TABS
6000.0000 [IU] | ORAL_TABLET | ORAL | Status: DC | PRN
  Filled 2017-05-19: qty 2

## 2017-05-19 MED ORDER — DEXAMETHASONE SODIUM PHOSPHATE 10 MG/ML IJ SOLN
INTRAMUSCULAR | Status: DC | PRN
  Administered 2017-05-19: 10 mg via INTRAVENOUS

## 2017-05-19 MED ORDER — PHENYLEPHRINE 40 MCG/ML (10ML) SYRINGE FOR IV PUSH (FOR BLOOD PRESSURE SUPPORT)
PREFILLED_SYRINGE | INTRAVENOUS | Status: DC | PRN
  Administered 2017-05-19 (×2): 40 ug via INTRAVENOUS
  Administered 2017-05-19: 80 ug via INTRAVENOUS

## 2017-05-19 MED ORDER — OXYCODONE HCL 5 MG PO TABS: 5.0000 mg | ORAL_TABLET | Freq: Once | ORAL | Status: DC | PRN

## 2017-05-19 MED ORDER — FENTANYL CITRATE (PF) 100 MCG/2ML IJ SOLN
INTRAMUSCULAR | Status: AC
  Administered 2017-05-19: 50 ug via INTRAVENOUS
  Filled 2017-05-19: qty 2

## 2017-05-19 MED ORDER — EPHEDRINE 5 MG/ML INJ
INTRAVENOUS | Status: AC
  Filled 2017-05-19: qty 10

## 2017-05-19 MED ORDER — MIDAZOLAM HCL 2 MG/2ML IJ SOLN
INTRAMUSCULAR | Status: AC
  Filled 2017-05-19: qty 2

## 2017-05-19 MED ORDER — HYDROMORPHONE HCL 1 MG/ML IJ SOLN
0.5000 mg | INTRAMUSCULAR | Status: DC | PRN
  Administered 2017-05-20: 0.5 mg via INTRAVENOUS
  Filled 2017-05-19: qty 1

## 2017-05-19 MED ORDER — BUPIVACAINE-EPINEPHRINE (PF) 0.5% -1:200000 IJ SOLN
INTRAMUSCULAR | Status: DC | PRN
  Administered 2017-05-19: 25 mL via PERINEURAL

## 2017-05-19 MED ORDER — CEFAZOLIN SODIUM-DEXTROSE 2-4 GM/100ML-% IV SOLN
2.0000 g | INTRAVENOUS | Status: AC
  Administered 2017-05-19: 2 g via INTRAVENOUS
  Filled 2017-05-19 (×2): qty 100

## 2017-05-19 MED ORDER — SUCCINYLCHOLINE CHLORIDE 200 MG/10ML IV SOSY
PREFILLED_SYRINGE | INTRAVENOUS | Status: AC
  Filled 2017-05-19: qty 10

## 2017-05-19 MED ORDER — LIDOCAINE 2% (20 MG/ML) 5 ML SYRINGE
INTRAMUSCULAR | Status: DC | PRN
  Administered 2017-05-19: 50 mg via INTRAVENOUS

## 2017-05-19 MED ORDER — OXYCODONE HCL 5 MG/5ML PO SOLN: 5.0000 mg | Freq: Once | ORAL | Status: DC | PRN

## 2017-05-19 MED ORDER — TRAMADOL HCL 50 MG PO TABS: 50.0000 mg | ORAL_TABLET | Freq: Four times a day (QID) | ORAL | Status: DC | PRN

## 2017-05-19 MED ORDER — DOCUSATE SODIUM 100 MG PO CAPS
100.0000 mg | ORAL_CAPSULE | Freq: Two times a day (BID) | ORAL | Status: DC
  Administered 2017-05-19 – 2017-05-20 (×2): 100 mg via ORAL
  Filled 2017-05-19 (×2): qty 1

## 2017-05-19 MED ORDER — CHLORHEXIDINE GLUCONATE 4 % EX LIQD
60.0000 mL | Freq: Once | CUTANEOUS | Status: AC
  Administered 2017-05-19: 4 via TOPICAL

## 2017-05-19 MED ORDER — LACTATED RINGERS IV SOLN
INTRAVENOUS | Status: AC
  Administered 2017-05-19 (×2): via INTRAVENOUS

## 2017-05-19 MED ORDER — SENNA 8.6 MG PO TABS
1.0000 | ORAL_TABLET | Freq: Two times a day (BID) | ORAL | Status: DC
  Administered 2017-05-20: 8.6 mg via ORAL
  Filled 2017-05-19 (×2): qty 1

## 2017-05-19 MED ORDER — CEFAZOLIN SODIUM-DEXTROSE 1-4 GM/50ML-% IV SOLN
1.0000 g | Freq: Four times a day (QID) | INTRAVENOUS | Status: AC
  Administered 2017-05-19 – 2017-05-20 (×3): 1 g via INTRAVENOUS
  Filled 2017-05-19 (×3): qty 50

## 2017-05-19 MED ORDER — TRANEXAMIC ACID 1000 MG/10ML IV SOLN
1000.0000 mg | INTRAVENOUS | Status: AC
  Administered 2017-05-19: 1000 mg via INTRAVENOUS
  Filled 2017-05-19: qty 1100

## 2017-05-19 MED ORDER — FENTANYL CITRATE (PF) 100 MCG/2ML IJ SOLN
50.0000 ug | Freq: Once | INTRAMUSCULAR | Status: AC
  Administered 2017-05-19: 50 ug via INTRAVENOUS

## 2017-05-19 MED ORDER — PHENYLEPHRINE HCL 10 MG/ML IJ SOLN
INTRAVENOUS | Status: DC | PRN
  Administered 2017-05-19: 20 ug/min via INTRAVENOUS

## 2017-05-19 MED ORDER — SENNA 8.6 MG PO TABS: 1.0000 | ORAL_TABLET | Freq: Every evening | ORAL | Status: DC | PRN

## 2017-05-19 MED ORDER — PHENYLEPHRINE 40 MCG/ML (10ML) SYRINGE FOR IV PUSH (FOR BLOOD PRESSURE SUPPORT)
PREFILLED_SYRINGE | INTRAVENOUS | Status: AC
  Filled 2017-05-19: qty 10

## 2017-05-19 MED ORDER — FENTANYL CITRATE (PF) 250 MCG/5ML IJ SOLN
INTRAMUSCULAR | Status: DC | PRN
  Administered 2017-05-19: 50 ug via INTRAVENOUS

## 2017-05-19 MED ORDER — ONDANSETRON HCL 4 MG/2ML IJ SOLN: 4.0000 mg | Freq: Four times a day (QID) | INTRAMUSCULAR | Status: DC | PRN

## 2017-05-19 MED ORDER — TRAMADOL HCL 50 MG PO TABS
50.0000 mg | ORAL_TABLET | Freq: Four times a day (QID) | ORAL | Status: DC | PRN
  Administered 2017-05-19 – 2017-05-20 (×3): 100 mg via ORAL
  Filled 2017-05-19 (×2): qty 2
  Filled 2017-05-19 (×2): qty 1

## 2017-05-19 MED ORDER — ROCURONIUM BROMIDE 10 MG/ML (PF) SYRINGE
PREFILLED_SYRINGE | INTRAVENOUS | Status: DC | PRN
  Administered 2017-05-19 (×2): 10 mg via INTRAVENOUS
  Administered 2017-05-19: 20 mg via INTRAVENOUS
  Administered 2017-05-19: 40 mg via INTRAVENOUS
  Administered 2017-05-19: 20 mg via INTRAVENOUS

## 2017-05-19 SURGICAL SUPPLY — 65 items
AID PSTN UNV HD RSTRNT DISP (MISCELLANEOUS) ×1
BASEPLATE GLENOSPHERE 25 STD (Miscellaneous) ×2 IMPLANT
BASEPLATE GLENOSPHERE 25MM STD (Miscellaneous) ×1 IMPLANT
BLADE SAW SAG 73X25 THK (BLADE) ×2
BLADE SAW SGTL 73X25 THK (BLADE) ×1 IMPLANT
BSPLAT GLND STD 25 RVRS SHLDR (Miscellaneous) ×1 IMPLANT
CAP SHOULDER REVTOTAL 2 ×2 IMPLANT
CEMENT HV SMART SET (Cement) ×6 IMPLANT
CHLORAPREP W/TINT 26ML (MISCELLANEOUS) ×9 IMPLANT
CLOSURE WOUND 1/2 X4 (GAUZE/BANDAGES/DRESSINGS) ×1
COVER SURGICAL LIGHT HANDLE (MISCELLANEOUS) ×3 IMPLANT
DRAPE C-ARM 42X72 X-RAY (DRAPES) ×3 IMPLANT
DRAPE HALF SHEET 40X57 (DRAPES) ×6 IMPLANT
DRAPE INCISE IOBAN 66X45 STRL (DRAPES) ×5 IMPLANT
DRAPE ORTHO SPLIT ENLRG (DRAPES) ×6 IMPLANT
DRAPE U-SHAPE 47X51 STRL (DRAPES) IMPLANT
ELECT REM PT RETURN 9FT ADLT (ELECTROSURGICAL) ×3
ELECTRODE REM PT RTRN 9FT ADLT (ELECTROSURGICAL) ×1 IMPLANT
GLOVE BIO SURGEON STRL SZ7.5 (GLOVE) ×3 IMPLANT
GLOVE BIOGEL PI IND STRL 6 (GLOVE) ×1 IMPLANT
GLOVE BIOGEL PI IND STRL 7.0 (GLOVE) ×1 IMPLANT
GLOVE BIOGEL PI IND STRL 8 (GLOVE) ×1 IMPLANT
GLOVE BIOGEL PI INDICATOR 6 (GLOVE) ×2
GLOVE BIOGEL PI INDICATOR 7.0 (GLOVE) ×2
GLOVE BIOGEL PI INDICATOR 8 (GLOVE) ×2
GLOVE ECLIPSE 8.0 STRL XLNG CF (GLOVE) ×6 IMPLANT
GLOVE SURG SS PI 6.0 STRL IVOR (GLOVE) ×3 IMPLANT
GOWN STRL REUS W/ TWL LRG LVL3 (GOWN DISPOSABLE) ×1 IMPLANT
GOWN STRL REUS W/ TWL XL LVL3 (GOWN DISPOSABLE) ×2 IMPLANT
GOWN STRL REUS W/TWL LRG LVL3 (GOWN DISPOSABLE) ×3
GOWN STRL REUS W/TWL XL LVL3 (GOWN DISPOSABLE) ×6
GUIDEWIRE GLENOID 2.5X220 (WIRE) IMPLANT
HANDPIECE INTERPULSE COAX TIP (DISPOSABLE) ×3
KIT BASIN OR (CUSTOM PROCEDURE TRAY) ×3 IMPLANT
KIT BEACH CHAIR TRIMANO (MISCELLANEOUS) ×3 IMPLANT
KIT CEMENT MIXING ZVPLASTY (KITS) ×3 IMPLANT
KIT ROOM TURNOVER OR (KITS) ×3 IMPLANT
MANIFOLD NEPTUNE II (INSTRUMENTS) ×3 IMPLANT
NEEDLE HYPO 25GX1X1/2 BEV (NEEDLE) IMPLANT
NEEDLE MAYO TROCAR (NEEDLE) IMPLANT
NS IRRIG 1000ML POUR BTL (IV SOLUTION) ×3 IMPLANT
PACK SHOULDER (CUSTOM PROCEDURE TRAY) ×3 IMPLANT
PAD ARMBOARD 7.5X6 YLW CONV (MISCELLANEOUS) ×6 IMPLANT
RESTRAINT HEAD UNIVERSAL NS (MISCELLANEOUS) ×3 IMPLANT
SCREW BONE 6.5 OD 30 NON BIO (Screw) ×2 IMPLANT
SCREW DRILL BIT IMPLANT
SET HNDPC FAN SPRY TIP SCT (DISPOSABLE) ×1 IMPLANT
SMARTMIX MINI TOWER (MISCELLANEOUS)
SPONGE LAP 18X18 X RAY DECT (DISPOSABLE) ×3 IMPLANT
STEM LONG PTC HUMERALTI SZ 2B (Stem) ×2 IMPLANT
STRIP CLOSURE SKIN 1/2X4 (GAUZE/BANDAGES/DRESSINGS) ×2 IMPLANT
SUCTION FRAZIER HANDLE 10FR (MISCELLANEOUS)
SUCTION TUBE FRAZIER 10FR DISP (MISCELLANEOUS) IMPLANT
SUT ETHIBOND 2 V 37 (SUTURE) ×3 IMPLANT
SUT ETHIBOND NAB CT1 #1 30IN (SUTURE) ×3 IMPLANT
SUT FIBERWIRE #5 38 CONV NDL (SUTURE) ×18
SUT MNCRL AB 3-0 PS2 18 (SUTURE) ×3 IMPLANT
SUT VIC AB 2-0 CT1 27 (SUTURE) ×3
SUT VIC AB 2-0 CT1 TAPERPNT 27 (SUTURE) ×1 IMPLANT
SUTURE FIBERWR #5 38 CONV NDL (SUTURE) ×6 IMPLANT
TOWEL OR 17X24 6PK STRL BLUE (TOWEL DISPOSABLE) ×3 IMPLANT
TOWEL OR 17X26 10 PK STRL BLUE (TOWEL DISPOSABLE) ×3 IMPLANT
TOWER SMARTMIX MINI (MISCELLANEOUS) IMPLANT
TRAY FOLEY CATH SILVER 14FR (SET/KITS/TRAYS/PACK) IMPLANT
WATER STERILE IRR 1000ML POUR (IV SOLUTION) ×3 IMPLANT

## 2017-05-19 NOTE — Progress Notes (Signed)
Came to see patient, in surgery.  Vitals stable-- plan for d/c to SNF in AM if she has an uneventful recovery.  Eulogio Bear DO

## 2017-05-19 NOTE — Anesthesia Preprocedure Evaluation (Signed)
Anesthesia Evaluation  Patient identified by MRN, date of birth, ID band Patient awake    Reviewed: Allergy & Precautions, H&P , NPO status , Patient's Chart, lab work & pertinent test results  Airway Mallampati: II   Neck ROM: full    Dental   Pulmonary neg pulmonary ROS,    breath sounds clear to auscultation       Cardiovascular hypertension, + dysrhythmias Supra Ventricular Tachycardia  Rhythm:regular Rate:Normal     Neuro/Psych    GI/Hepatic   Endo/Other  Hypothyroidism   Renal/GU      Musculoskeletal   Abdominal   Peds  Hematology  (+) anemia ,   Anesthesia Other Findings   Reproductive/Obstetrics                             Anesthesia Physical Anesthesia Plan  ASA: III  Anesthesia Plan: General   Post-op Pain Management:  Regional for Post-op pain   Induction: Intravenous  PONV Risk Score and Plan: 3 and Ondansetron, Dexamethasone and Treatment may vary due to age or medical condition  Airway Management Planned: Oral ETT  Additional Equipment:   Intra-op Plan:   Post-operative Plan: Extubation in OR  Informed Consent: I have reviewed the patients History and Physical, chart, labs and discussed the procedure including the risks, benefits and alternatives for the proposed anesthesia with the patient or authorized representative who has indicated his/her understanding and acceptance.     Plan Discussed with: CRNA, Anesthesiologist and Surgeon  Anesthesia Plan Comments:         Anesthesia Quick Evaluation

## 2017-05-19 NOTE — Anesthesia Procedure Notes (Signed)
Anesthesia Regional Block: Interscalene brachial plexus block   Pre-Anesthetic Checklist: ,, timeout performed, Correct Patient, Correct Site, Correct Laterality, Correct Procedure, Correct Position, site marked, Risks and benefits discussed,  Surgical consent,  Pre-op evaluation,  At surgeon's request and post-op pain management  Laterality: Right  Prep: chloraprep       Needles:  Injection technique: Single-shot  Needle Type: Echogenic Stimulator Needle     Needle Length: 5cm  Needle Gauge: 22     Additional Needles:   Procedures:, nerve stimulator,,,,,,,   Nerve Stimulator or Paresthesia:  Response: biceps flexion, 0.45 mA,   Additional Responses:   Narrative:  Start time: 05/19/2017 9:36 AM End time: 05/19/2017 9:42 AM Injection made incrementally with aspirations every 5 mL.  Performed by: Personally  Anesthesiologist: Albertha Ghee, MD  Additional Notes: Functioning IV was confirmed and monitors were applied.  A 2mm 22ga Arrow echogenic stimulator needle was used. Sterile prep and drape,hand hygiene and sterile gloves were used.  Negative aspiration and negative test dose prior to incremental administration of local anesthetic. The patient tolerated the procedure well.  Ultrasound guidance: relevent anatomy identified, needle position confirmed, local anesthetic spread visualized around nerve(s), vascular puncture avoided.  Image printed for medical record.

## 2017-05-19 NOTE — Anesthesia Procedure Notes (Signed)
Procedure Name: Intubation Date/Time: 05/19/2017 10:15 AM Performed by: Wilburn Cornelia, CRNA Pre-anesthesia Checklist: Patient identified, Emergency Drugs available, Suction available, Patient being monitored and Timeout performed Patient Re-evaluated:Patient Re-evaluated prior to induction Oxygen Delivery Method: Circle system utilized Preoxygenation: Pre-oxygenation with 100% oxygen Induction Type: IV induction Ventilation: Mask ventilation without difficulty Laryngoscope Size: Mac and 3 Grade View: Grade I Tube type: Oral Tube size: 7.0 mm Number of attempts: 1 Airway Equipment and Method: Stylet Placement Confirmation: ETT inserted through vocal cords under direct vision,  positive ETCO2,  CO2 detector and breath sounds checked- equal and bilateral Secured at: 21 cm Tube secured with: Tape Dental Injury: Teeth and Oropharynx as per pre-operative assessment

## 2017-05-19 NOTE — Interval H&P Note (Signed)
History and Physical Interval Note:  05/19/2017 10:03 AM  Lori Krueger  has presented today for surgery, with the diagnosis of RIGHT PROXIMAL HUMROUS FRACTURE  The various methods of treatment have been discussed with the patient and family. After consideration of risks, benefits and other options for treatment, the patient has consented to  Procedure(s): REVERSETOTAL SHOULDER ARTHROPLASTY (Right) as a surgical intervention .  The patient's history has been reviewed, patient examined, no change in status, stable for surgery.  I have reviewed the patient's chart and labs.  Questions were answered to the patient's satisfaction.     Hiram Gash

## 2017-05-19 NOTE — Op Note (Signed)
Orthopaedic Surgery Operative Note (CSN: [id number])  Lori Krueger  10-19-1934 Date of Surgery: 05/19/2017   Diagnoses:  RIGHT PROXIMAL HUMERUS FRACTURE  Procedures:   * REVERSETOTAL SHOULDER ARTHROPLASTY  CPT 68341   Operative Finding Successful completion of planned procedure.  Head fracture fragment was dislocated and position below the conjoined tendon.  This was removed and the axillary nerve was noted to be intact the completion of the case.  Good approximation of the tuberosity was noted.  Final result was a stable joint throughout range of motion.  Post-operative plan: The patient will be nonweightbearing in a sling for 4 weeks, therapy to start at that point.  The patient will be admitted back to the floor with plan for disposition tomorrow.  DVT prophylaxis not indicated in a otherwise ambulatory patient with upper extremity surgery.  Pain control with PRN pain medication preferring oral medicines.  Follow up plan will be scheduled in approximately 7 days for wound check with AP of the shoulder  Post-Op Diagnosis: Same Surgeons:Jenin Birdsall Griffin Basil MD, co-surgeon Katha Hamming MD. Location: [facility] OR ROOM 03 Anesthesia: Choice Antibiotics: Ancef 2g preop Tourniquet time: * No tourniquets in log * Estimated Blood Loss: 20 Complications: None Specimens: None Implants: Implant Name Type Inv. Item Serial No. Manufacturer Lot No. LRB No. Used Action  BASEPLATE GLENOSPHERE 96QI STD - [id number] Miscellaneous BASEPLATE GLENOSPHERE 41DE STD [id number] [employer]  Right 1 Implanted  SCREW 6.5 x 30    [employer]  Right 1 Implanted  SCREW 5.5 X 38    [employer]  Right 1 Implanted  SCREW 5.5X 30    [employer]  Right 1 Implanted  STANDARD GLENOSPHERE    [id number] [employer]  Right 1 Implanted  CEMENT RESTRICTOR   [id number] [employer]  Right 1 Implanted  CEMENT HV SMART SET - [id number] Cement CEMENT HV SMART SET  DEPUY SYNTHES [id number] Right 2 Implanted  STEM LONG PTC HUMERALTI SZ 2B - [id number]  Stem STEM LONG PTC HUMERALTI SZ 2B [id number] [employer]  Right 1 Implanted  IMPLANT REVERSE SHOULDER 0X3.5 - [id number] Shoulder IMPLANT REVERSE SHOULDER 0X3.5 [id number] [employer]  Right 1 Implanted  INSERT HUMERAL 36X6MM 12.5DEG - [id number] Insert INSERT HUMERAL 36X6MM 12.5DEG [id number] [employer]  Right 1 Implanted    Indications for Surgery:   Lori Krueger is a 83 y.o. female with fall resulting in a fracture dislocation of her is right proximal humerus Dr. Lennette Bihari Haddix passed my assistance in management of this patient. Benefits and risks of operative and nonoperative management were discussed prior to surgery with patient/guardian(s) and informed consent form was completed.  Specific risks including infection, need for additional surgery,   Dislocation, axillary nerve palsy is, he is continued pain and reduced range of motion compared to baseline.   Procedure:   The patient was identified in the preoperative holding area where the surgical site was marked. The patient was taken to the OR where a procedural timeout was called and the above noted anesthesia was induced.  The patient was positioned modified beachchair on an allen table with a Trimano.  Preoperative antibiotics were dosed.  The patient's right arm was prepped and draped in the usual sterile fashion.  A second preoperative timeout was called.      Standard deltopectoral approach was performed with a #10 blade. We dissected down to the subcutaneous tissues and the cephalic vein was taken laterally with the deltoid. Clavipectoral fascia was incised in line with the incision. Deep retractors were  placed. The long of the biceps tendon was identified and there was significant tenosynovitis present.  Tenodesis was performed to the pectoralis tendon with #2 Ethibond. The remaining biceps was followed up into the rotator interval where it was released.   We used the bicipital groove as a landmark for the lesser and greater tuberosity  fragments.  We were able to mobilize the lesser tuberosity fragment and placed stay sutures in the bone tendon junction to help with mobilization.  This point we were able to identify the  greater tuberosity fragment and 4 #5  FiberWire sutures were used to place into this for eventual repair of the tuberosities.  Once these were both mobilized we took care to identify the shaft fragment as well as the head fragment.  We carefully identified the head fragment were able to manually remove it from beneath the conjoined tendon at its area of dislocation.  At this point the axillary nerve was found and palpated and with a tug test noted to be intact.  Protected throughout the remainder of the case with blunt retractors.   We then released the SGHL with bovie cautery prior to placing a curved mayo at the junction of the anterior glenoid well above the axillary nerve and bluntly dissecting the subscapularis from the capsule.  We then carefully protected the axillary nerve as we gently released the inferior capsule to fully mobilize the subscapularis.  An anterior deltoid retractor was then placed as well as a small Hohmann retractor superiorly.  The glenoid was relatively preserved as we would expect in this fracture patient.  The remaining labrum was removed circumferentially taking great care not to disrupt the posterior capsule.   The glenoid drill guide was placed and used to drill a guide pin in the center, inferior position. The glenoid face was then reamed concentrically over the guide wire. The center hole was drilled over the guidepin in a near anatomic angle of version. Next the glenoid vault was drilled back to a depth of 30 mm.  We tapped and then placed a 55m size baseplate with 0 lateralization was selected with a 30 mm x 6.573mlength central screw.  The base plate was screwed into the glenoid vault obtaining secure fixation. We next placed superior and inferior locking screws for additional fixation.   Next a 36 mm glenosphere was selected and impacted onto the baseplate. The center screw was tightened.  We then repositioned the arm to give access to the humeral shaft fragment.  Drill holes were placed and fiberwire sutures in the shaft for vertical fixation of the tuberosities.  We broached starting with a size one broach and broaching up to 2 which obtained an appropriate fit above the pec but was not solidly fixed necessitating cement.   At that point we prepared the humeral shaft for cement.  We irrigated it copiously and then placed a cement restrictor at the appropriate level below the trial stem.  We then placed cement and our number to size some and held in place for 15 minutes while the cement set.  We achieved good fixation of the component in this fashion.  We trialed with multiple size tray and polyethylene options and selected a 0 high which provided good stability and range of motion without excess soft tissue tension. The offset was dialed in to match the normal anatomy. The shoulder was trialed.  There was good ROM in all planes and the shoulder was stable with no inferior translation.  We then  mobilized her tuberosities again and placed the anterior deep limbs of the 4 #5 fiber wires around the stem.  1 of these was tied down fixing the greater tuberosity in place after bone graft harvest from the humeral head component was placed underneath.  A +0 high offset tray was selected and impacted onto the stem.  We then were able to use the #5 fiber wires A 36+6 polyethylene liner was impacted onto the stem.  The joint was reduced and thoroughly irrigated with pulsatile lavage. The remaining sutures were then placed through the subscapularis and the bone tendon junction and the tuberosities were reduced.  2 horizontally secure the tuberosities before placing vertical fixation with the suture that was placed into the shaft.  Tuberosities moved to the unit were happy with her overall reduction.   We irrigated copiously at this point.  Hemostasis was obtained. The deltopectoral interval was reapproximated with #1 Ethibond. The subcutaneous tissues were closed with 3-0 Vicryl and the skin was closed with running monocryl.    The wounds were cleaned and dried and an Aquacel dressing was placed. The drapes taken down. The arm was placed into sling with abduction pillow. Patient was awakened, extubated, and transferred to the recovery room in stable condition. There were no intraoperative complications. The sponge, needle, and attention counts were  correct at the end of the case.   Katha Hamming MD present and integral for management of this complex patient. Specifically helping with exposure and management of the fracture fragments.

## 2017-05-19 NOTE — Transfer of Care (Signed)
Immediate Anesthesia Transfer of Care Note  Patient: Lori Krueger  Procedure(s) Performed: Sibley (Right )  Patient Location: PACU  Anesthesia Type:GA combined with regional for post-op pain  Level of Consciousness: awake and alert   Airway & Oxygen Therapy: Patient Spontanous Breathing and Patient connected to nasal cannula oxygen  Post-op Assessment: Report given to RN and Post -op Vital signs reviewed and stable  Post vital signs: Reviewed and stable  Last Vitals:  Vitals:   05/19/17 0947 05/19/17 0952  BP: (!) 140/59 (!) 133/53  Pulse: 77 71  Resp: 16 (!) 25  Temp:    SpO2: 97% 97%    Last Pain:  Vitals:   05/19/17 0830  TempSrc:   PainSc: 2       Patients Stated Pain Goal: 3 (41/63/84 5364)  Complications: No apparent anesthesia complications

## 2017-05-19 NOTE — Progress Notes (Addendum)
PROGRESS NOTE    Lori Krueger  OZH:[id number] DOB: Jul 10, 1934 DOA: 05/16/2017 PCP: Haywood Pao, MD   Outpatient Specialists:     Brief Narrative:  Lori Krueger is a 83 y.o. female with medical history significant of HTN comes in after tripping and falling and injuring her right shoulder.  She had no LOC.  She lives alone and is very active.  Shoulder was attempted to be reduced in ED unsuccessfully.  Pt got hypoxic after sedation with now normal oxygen sats at 100% during my interview.  Ortho was called who advised ct scan and then obs overnight.  Pt does report a uri and cough for the last week or so.  No fevers.  No le edema or swelling.  No chest pain.     Assessment & Plan:   Principal Problem:   Hypertension Active Problems:   Hypoxia   Hypertension -continue home meds    Right humeral displaced fracture -s/psurgery on Wednesday -pain control with tramadol -ambulate  Leukocytosis -suspect reactive -no sign of infection  Nausea- from pain medications -PRN zofran/phenergan    Hypoxia  - resolved-- suspect related to sedating medications that patient received in the ED -No infiltrate on cxr and procalcitonin negative--  No abx -Incentive spirometry  Episode of SVT 1/8 -on BB -PRN BB -resolved     DVT prophylaxis:  SCD's  Code Status: Full Code   Family Communication: At bedside  Disposition Plan:  SNF in AM?  Consultants:   ortho    Subjective: Thinks she is doing suprisingly well after surgery  Objective: Vitals:   05/19/17 1332 05/19/17 1347 05/19/17 1402 05/19/17 1426  BP: (!) 100/54 (!) 96/53 (!) 107/59 (!) 103/52  Pulse: 61 62 61 63  Resp: 15 14 19 18   Temp:   (!) 97.5 F (36.4 C) (!) 97.5 F (36.4 C)  TempSrc:    Oral  SpO2: 96% 95% 97% 96%  Weight:      Height:        Intake/Output Summary (Last 24 hours) at 05/19/2017 1727 Last data filed at 05/19/2017 1716 Gross per 24 hour  Intake 1370 ml  Output 250 ml  Net  1120 ml   Filed Weights   05/16/17 1224 05/17/17 0000 05/19/17 0915  Weight: 63.5 kg (140 lb) 63.5 kg (140 lb) 63.5 kg (140 lb)    Examination:  General exam: in bed, tired appearing Cardiovascular system: rrr Psychiatry: mood normal    Data Reviewed: I have personally reviewed following labs and imaging studies  CBC: Recent Labs  Lab 05/16/17 1359 05/16/17 1854 05/18/17 0642  WBC  --  14.7* 12.7*  NEUTROABS  --  13.6*  --   HGB 12.9 12.1 10.8*  HCT 38.0 37.2 33.0*  MCV  --  97.4 97.6  PLT  --  294 629   Basic Metabolic Panel: Recent Labs  Lab 05/16/17 1359 05/16/17 1854 05/18/17 0642  NA 140 135 134*  K 4.0 4.5 3.7  CL 100* 99* 99*  CO2  --  25 27  GLUCOSE 164* 145* 129*  BUN 19 17 17   CREATININE 0.70 0.64 0.69  CALCIUM  --  8.8* 8.9   GFR: Estimated Creatinine Clearance: 47.9 mL/min (by C-G formula based on SCr of 0.69 mg/dL). Liver Function Tests: No results for input(s): AST, ALT, ALKPHOS, BILITOT, PROT, ALBUMIN in the last 168 hours. No results for input(s): LIPASE, AMYLASE in the last 168 hours. No results for input(s): AMMONIA in the last 168 hours.  Coagulation Profile: No results for input(s): INR, PROTIME in the last 168 hours. Cardiac Enzymes: No results for input(s): CKTOTAL, CKMB, CKMBINDEX, TROPONINI in the last 168 hours. BNP (last 3 results) No results for input(s): PROBNP in the last 8760 hours. HbA1C: No results for input(s): HGBA1C in the last 72 hours. CBG: No results for input(s): GLUCAP in the last 168 hours. Lipid Profile: No results for input(s): CHOL, HDL, LDLCALC, TRIG, CHOLHDL, LDLDIRECT in the last 72 hours. Thyroid Function Tests: No results for input(s): TSH, T4TOTAL, FREET4, T3FREE, THYROIDAB in the last 72 hours. Anemia Panel: No results for input(s): VITAMINB12, FOLATE, FERRITIN, TIBC, IRON, RETICCTPCT in the last 72 hours. Urine analysis: No results found for: COLORURINE, APPEARANCEUR, LABSPEC, PHURINE, GLUCOSEU,  HGBUR, BILIRUBINUR, KETONESUR, PROTEINUR, UROBILINOGEN, NITRITE, LEUKOCYTESUR   ) Recent Results (from the past 240 hour(s))  Surgical PCR screen     Status: None   Collection Time: 05/17/17  1:10 AM  Result Value Ref Range Status   MRSA, PCR NEGATIVE NEGATIVE Final   Staphylococcus aureus NEGATIVE NEGATIVE Final    Comment: (NOTE) The Xpert SA Assay (FDA approved for NASAL specimens in patients 46 years of age and older), is one component of a comprehensive surveillance program. It is not intended to diagnose infection nor to guide or monitor treatment.       Anti-infectives (From admission, onward)   Start     Dose/Rate Route Frequency Ordered Stop   05/19/17 1630  ceFAZolin (ANCEF) IVPB 1 g/50 mL premix     1 g 100 mL/hr over 30 Minutes Intravenous Every 6 hours 05/19/17 1423 05/20/17 1029   05/19/17 0830  ceFAZolin (ANCEF) IVPB 2g/100 mL premix     2 g 200 mL/hr over 30 Minutes Intravenous On call to O.R. 05/19/17 0814 05/19/17 1020   05/16/17 1900  levofloxacin (LEVAQUIN) IVPB 500 mg     500 mg 100 mL/hr over 60 Minutes Intravenous  Once 05/16/17 1826 05/16/17 2003       Radiology Studies: Dg Shoulder 1v Right  Result Date: 05/19/2017 CLINICAL DATA:  RIGHT shoulder replacement EXAM: DG C-ARM 61-120 MIN; RIGHT SHOULDER - 2 VIEW COMPARISON:  CT RIGHT shoulder 05/16/2017 FLUOROSCOPY TIME:  0 minutes 7 seconds Images obtained: 2 FINDINGS: First image demonstrates a comminuted displaced fracture of the proximal RIGHT humerus involving the surgical neck, head and greater tuberosity. Dislocation of the RIGHT humeral head. AC joint alignment normal. Bones diffusely demineralized. Second image demonstrates placement of a reverse RIGHT shoulder prosthesis without fracture or dislocation on single AP view. IMPRESSION: Reverse RIGHT shoulder arthroplasty for comminuted fracture dislocation of the proximal RIGHT humerus. Electronically Signed   By: Lavonia Dana M.D.   On: 05/19/2017 12:49     Dg Shoulder Right Port  Result Date: 05/19/2017 CLINICAL DATA:  Right shoulder arthroplasty. EXAM: PORTABLE RIGHT SHOULDER COMPARISON:  05/19/2017 FINDINGS: Well seated components of a reversed right shoulder arthroplasty. Fracture involving the superior cortex of the humerus versus osteotomy defect. Recommend clinical correlation. IMPRESSION: Well seated components of a reversed right shoulder arthroplasty. Fracture versus osteotomy defect along the superior cortex of the proximal humerus. Electronically Signed   By: Marijo Sanes M.D.   On: 05/19/2017 15:16   Dg C-arm 1-60 Min  Result Date: 05/19/2017 CLINICAL DATA:  RIGHT shoulder replacement EXAM: DG C-ARM 61-120 MIN; RIGHT SHOULDER - 2 VIEW COMPARISON:  CT RIGHT shoulder 05/16/2017 FLUOROSCOPY TIME:  0 minutes 7 seconds Images obtained: 2 FINDINGS: First image demonstrates a comminuted displaced fracture  of the proximal RIGHT humerus involving the surgical neck, head and greater tuberosity. Dislocation of the RIGHT humeral head. AC joint alignment normal. Bones diffusely demineralized. Second image demonstrates placement of a reverse RIGHT shoulder prosthesis without fracture or dislocation on single AP view. IMPRESSION: Reverse RIGHT shoulder arthroplasty for comminuted fracture dislocation of the proximal RIGHT humerus. Electronically Signed   By: Lavonia Dana M.D.   On: 05/19/2017 12:49        Scheduled Meds: . acetaminophen  1,000 mg Oral Q6H  . celecoxib  200 mg Oral Q12H  . docusate sodium  100 mg Oral BID  . levothyroxine  100 mcg Oral Once per day on Mon Tue Wed Thu Fri Sat  . metoprolol succinate  25 mg Oral Daily  . senna  1 tablet Oral BID  . sodium chloride flush  3 mL Intravenous Q12H   Continuous Infusions: . sodium chloride 250 mL (05/19/17 1631)  .  ceFAZolin (ANCEF) IV Stopped (05/19/17 1716)  . lactated ringers       LOS: 2 days    Time spent: 15 min    Geradine Girt, DO [facility] Pager  [phone]  If 7PM-7AM, please contact night-coverage [email or web address] Password [facility] 05/19/2017, 5:27 PM

## 2017-05-19 NOTE — Progress Notes (Signed)
Pt going down for surgery.

## 2017-05-20 ENCOUNTER — Encounter (HOSPITAL_COMMUNITY): Payer: Self-pay | Admitting: Orthopaedic Surgery

## 2017-05-20 DIAGNOSIS — I10 Essential (primary) hypertension: Secondary | ICD-10-CM

## 2017-05-20 DIAGNOSIS — S42201A Unspecified fracture of upper end of right humerus, initial encounter for closed fracture: Secondary | ICD-10-CM

## 2017-05-20 LAB — CBC
HEMATOCRIT: 27.8 % — AB (ref 36.0–46.0)
Hemoglobin: 9 g/dL — ABNORMAL LOW (ref 12.0–15.0)
MCH: 31.5 pg (ref 26.0–34.0)
MCHC: 32.4 g/dL (ref 30.0–36.0)
MCV: 97.2 fL (ref 78.0–100.0)
Platelets: 173 10*3/uL (ref 150–400)
RBC: 2.86 MIL/uL — ABNORMAL LOW (ref 3.87–5.11)
RDW: 13.2 % (ref 11.5–15.5)
WBC: 12.3 10*3/uL — AB (ref 4.0–10.5)

## 2017-05-20 LAB — BASIC METABOLIC PANEL
ANION GAP: 11 (ref 5–15)
BUN: 17 mg/dL (ref 6–20)
CALCIUM: 8.2 mg/dL — AB (ref 8.9–10.3)
CO2: 22 mmol/L (ref 22–32)
Chloride: 103 mmol/L (ref 101–111)
Creatinine, Ser: 0.73 mg/dL (ref 0.44–1.00)
GLUCOSE: 129 mg/dL — AB (ref 65–99)
POTASSIUM: 4.2 mmol/L (ref 3.5–5.1)
Sodium: 136 mmol/L (ref 135–145)

## 2017-05-20 MED ORDER — CELECOXIB 200 MG PO CAPS: 200.0000 mg | ORAL_CAPSULE | Freq: Two times a day (BID) | ORAL | Status: DC

## 2017-05-20 NOTE — Clinical Social Work Placement (Signed)
   CLINICAL SOCIAL WORK PLACEMENT  NOTE  Date:  05/20/2017  Patient Details  Name: Lori Krueger MRN: [id number] Date of Birth: [birth year]  Clinical Social Work is seeking post-discharge placement for this patient at the [place] level of care (*CSW will initial, date and re-position this form in  chart as items are completed):  Yes   Patient/family provided with [place] Work Department's list of facilities offering this level of care within the geographic area requested by the patient (or if unable, by the patient's family).  Yes   Patient/family informed of their freedom to choose among providers that offer the needed level of care, that participate in Medicare, Medicaid or managed care program needed by the patient, have an available bed and are willing to accept the patient.  Yes   Patient/family informed of [facility]'s ownership interest in [facility] and [facility], as well as of the fact that they are under no obligation to receive care at these facilities.  PASRR submitted to EDS on       PASRR number received on 05/18/17     Existing PASRR number confirmed on       FL2 transmitted to all facilities in geographic area requested by pt/family on 05/18/17     FL2 transmitted to all facilities within larger geographic area on       Patient informed that his/her managed care company has contracts with or will negotiate with certain facilities, including the following:        Yes   Patient/family informed of bed offers received.  Patient chooses bed at [facility]     Physician recommends and patient chooses bed at      Patient to be transferred to [facility] on 05/20/17.  Patient to be transferred to facility by [facility]     Patient family notified on 05/20/17 of transfer.  Name of family member notified:  family at bedside     PHYSICIAN       Additional Comment:     _______________________________________________ Normajean Baxter, LCSW 05/20/2017, 1:48 PM

## 2017-05-20 NOTE — Social Work (Signed)
CSW was advised by SNF that insurance auth received.  CSW will f/u for disposition.  Elissa Hefty, LCSW Clinical Social Worker [phone]

## 2017-05-20 NOTE — Anesthesia Postprocedure Evaluation (Signed)
Anesthesia Post Note  Patient: Lori Krueger  Procedure(s) Performed: REVERSETOTAL SHOULDER ARTHROPLASTY (Right )     Patient location during evaluation: PACU Anesthesia Type: General Level of consciousness: awake and alert Pain management: pain level controlled Vital Signs Assessment: post-procedure vital signs reviewed and stable Respiratory status: spontaneous breathing, nonlabored ventilation, respiratory function stable and patient connected to nasal cannula oxygen Cardiovascular status: blood pressure returned to baseline and stable Postop Assessment: no apparent nausea or vomiting Anesthetic complications: no    Last Vitals:  Vitals:   05/19/17 2354 05/20/17 0625  BP: 117/62 (!) 165/77  Pulse: 71 64  Resp: 17   Temp: 37 C 36.4 C  SpO2: 92% 94%    Last Pain:  Vitals:   05/20/17 0731  TempSrc:   PainSc: [place]

## 2017-05-20 NOTE — Social Work (Addendum)
CSW met back with patient and they have accepted bed offer from SNF-[facility].  CSW f/u with SNF as SNF will need to obtain insurance auth. CSW confirmed bed with SNF and they will initiate auth.  Plan is for possible discharge today pending auth.  Patricia Pencil, LCSW Clinical Social Worker [phone]   

## 2017-05-20 NOTE — Evaluation (Signed)
Physical Therapy Re-Evaluation Patient Details Name: Lori Krueger MRN: [id number] DOB: 01-04-1935 Today's Date: 05/20/2017   History of Present Illness  s/p REVERSE TOTAL SHOULDER ARTHROPLASTY to repair fracture from fall sustained on 05/16/17. PMH of Dyslipidemia, Hypothyroidism, Osteoporosis, Skin cancer, and SVT (supraventricular tachycardia) ([place]).    Clinical Impression  Patient is s/p above surgery resulting in functional limitations due to the deficits listed below (see PT Problem List). PTA, pt independent community ambulating without AD. Upon re-evaluation, patient presents with post op pain and weakness, and balance deficits at baseline, that limit her mobility. Patients BLE strength is good and able to ambulate in hallway today with SPC and min guard. Patient d/c for SNF today and feel this is appropriate. Discussed safety considerations with patient and family and all questions were answered.    Patient will benefit from skilled PT to increase their independence and safety with mobility to allow discharge to the venue listed below.       Follow Up Recommendations SNF    Equipment Recommendations       Recommendations for Other Services       Precautions / Restrictions Precautions Precautions: Fall;Shoulder Shoulder Interventions: Shoulder sling/immobilizer;At all times;Off for dressing/bathing/exercises Precaution Booklet Issued: Yes (comment) Precaution Comments: sling ok to come off for bathing/dressing as long as RUE in supported position per MD  Required Braces or Orthoses: Sling Restrictions Weight Bearing Restrictions: Yes RUE Weight Bearing: Non weight bearing      Mobility  Bed Mobility Overal bed mobility: Needs Assistance Bed Mobility: Sit to Supine     Supine to sit: Min guard Sit to supine: Min assist;Min guard   General bed mobility comments: Patient able to supine to sit without physical assistance.   Transfers Overall transfer level: Needs  assistance Equipment used: 1 person hand held assist Transfers: Sit to/from Stand Sit to Stand: Min guard;Min assist Stand pivot transfers: Min guard       General transfer comment: min guard for safety. patient reports less dizziness this session  Ambulation/Gait Ambulation/Gait assistance: Min guard Ambulation Distance (Feet): 90 Feet Assistive device: Straight cane Gait Pattern/deviations: Step-through pattern;Staggering left;Staggering right(mild path deviations) Gait velocity: dereased   General Gait Details: Pt with less diziness, able to ambulate in hallway with SPC and min guard. Patient with mild path deviation and unsteadieness. Discussed with family importance of supervision for fall prevention.  Stairs            Wheelchair Mobility    Modified Rankin (Stroke Patients Only)       Balance Overall balance assessment: Needs assistance Sitting-balance support: Feet supported Sitting balance-Leahy Scale: Good     Standing balance support: Single extremity supported;During functional activity Standing balance-Leahy Scale: Fair Standing balance comment: Pt could stand for brief moments without support. External support sought for dynamic tasks in standing                             Pertinent Vitals/Pain Pain Assessment: Faces Faces Pain Scale: Hurts even more Pain Location: R shoulder Pain Descriptors / Indicators: Aching;Grimacing;Guarding Pain Intervention(s): Limited activity within patient's tolerance    Home Living Family/patient expects to be discharged to:: Skilled nursing facility Living Arrangements: Alone                    Prior Function Level of Independence: Independent         Comments: very active, independent, volunteers here  Hand Dominance   Dominant Hand: Left    Extremity/Trunk Assessment   Upper Extremity Assessment Upper Extremity Assessment: Defer to OT evaluation    Lower Extremity  Assessment Lower Extremity Assessment: Overall WFL for tasks assessed    Cervical / Trunk Assessment Cervical / Trunk Assessment: Kyphotic  Communication   Communication: No difficulties  Cognition Arousal/Alertness: Awake/alert Behavior During Therapy: WFL for tasks assessed/performed Overall Cognitive Status: Within Functional Limits for tasks assessed                                        General Comments General comments (skin integrity, edema, etc.): Discussed with patient and family recs for follow up Neuro OP PT to adderess balance deficits at baseline. Also instructed patient on minimizing risk for upper trap/neck strain from overguarding.     Exercises     Assessment/Plan    PT Assessment Patient needs continued PT services  PT Problem List Decreased strength;Decreased range of motion;Decreased activity tolerance;Decreased balance;Decreased mobility;Decreased knowledge of use of DME;Decreased knowledge of precautions;Pain       PT Treatment Interventions DME instruction;Gait training;Stair training;Functional mobility training;Therapeutic activities;Therapeutic exercise;Patient/family education    PT Goals (Current goals can be found in the Care Plan section)  Acute Rehab PT Goals Patient Stated Goal: recover and back to independence PT Goal Formulation: With patient Time For Goal Achievement: 05/31/17 Potential to Achieve Goals: Good    Frequency Min 3X/week   Barriers to discharge Decreased caregiver support      Co-evaluation               AM-PAC PT "6 Clicks" Daily Activity  Outcome Measure Difficulty turning over in bed (including adjusting bedclothes, sheets and blankets)?: A Lot Difficulty moving from lying on back to sitting on the side of the bed? : A Lot Difficulty sitting down on and standing up from a chair with arms (e.g., wheelchair, bedside commode, etc,.)?: A Lot Help needed moving to and from a bed to chair (including a  wheelchair)?: A Little Help needed walking in hospital room?: A Little Help needed climbing 3-5 steps with a railing? : A Lot 6 Click Score: 14    End of Session Equipment Utilized During Treatment: Gait belt(sling) Activity Tolerance: Patient tolerated treatment well Patient left: in chair;with call bell/phone within reach;with family/visitor present Nurse Communication: Mobility status PT Visit Diagnosis: Unsteadiness on feet (R26.81);Other abnormalities of gait and mobility (R26.89);Pain Pain - Right/Left: Right Pain - part of body: Shoulder    Time: 1610-9604 PT Time Calculation (min) (ACUTE ONLY): 22 min   Charges:   PT Evaluation $PT Re-evaluation: 1 Re-eval PT Treatments $Gait Training: 8-22 mins   PT G Codes:        Reinaldo Berber, PT, DPT Acute Rehab Services Pager: [phone]    Reinaldo Berber 05/20/2017, 2:22 PM

## 2017-05-20 NOTE — Clinical Social Work Note (Signed)
Clinical Social Work Assessment  Patient Details  Name: Lori Krueger MRN: [id number] Date of Birth: Nov 07, 1934  Date of referral:  05/20/17               Reason for consult:  Facility Placement                Permission sought to share information with:  Case Manager Permission granted to share information::  Yes, Verbal Permission Granted  Name::     Nutritional therapist::  SNF  Relationship::  grand daughter  Contact Information:     Housing/Transportation Living arrangements for the past 2 months:  Single Family Home Source of Information:  Patient, Other (Comment Required)(Granddaughter at bedside) Patient Interpreter Needed:  None Criminal Activity/Legal Involvement Pertinent to Current Situation/Hospitalization:  No - Comment as needed Significant Relationships:  Adult Children, Other Family Members, Friend Lives with:  Self Do you feel safe going back to the place where you live?  No Need for family participation in patient care:  Yes (Comment)  Care giving concerns:  Pt with new impairment and resides alone. Pt has no one to care for her at home and clinical team recommended SNF.  Pt indicated that she was independent with ambulation and ADL's prior to hospitalization.  CSW discussed disposition with granddaughter and patient at bedside.  Permission obtained to send to local SNF's. CSW provided family with list of SNF's and CSW will f/u for SNF selection.  CSW also discussed the Insurance Auth process and that the SNF would need to f/u. CSW answered questions about SNF process as well as transportation.  Social Worker assessment / plan:  CSW will f/u for disposition.  Employment status:  Retired Nurse, adult PT Recommendations:  [place] / Referral to community resources:  [place]  Patient/Family's Response to care:  Psychologist, prison and probation services of CSW meeting and discussing SNF plan.  Patient/Family's  Understanding of and Emotional Response to Diagnosis, Current Treatment, and Prognosis:  Patient/Family has good understanding of patient's impairment as they have already discussed it amongst themselves and have been planning for SNF. Pt indicated that she knows she will need more support and cannot return home yet. Pt hopeful to get back home soon. No other issues or concerns identified at this time.  Emotional Assessment Appearance:  Appears stated age Attitude/Demeanor/Rapport:  (Cooperative) Affect (typically observed):  Accepting, Appropriate Orientation:  Oriented to Situation, Oriented to  Time, Oriented to Place, Oriented to Self Alcohol / Substance use:  Not Applicable Psych involvement (Current and /or in the community):  No (Comment)  Discharge Needs  Concerns to be addressed:  Discharge Planning Concerns Readmission within the last 30 days:  No Current discharge risk:  Physical Impairment, Dependent with Mobility Barriers to Discharge:  No Barriers Identified   Normajean Baxter, LCSW 05/20/2017, 10:21 AM

## 2017-05-20 NOTE — Social Work (Signed)
Clinical Social Worker facilitated patient discharge including contacting patient family and facility to confirm patient discharge plans.  Clinical information faxed to facility and family agreeable with plan.    CSW arranged ambulance transport via [facility] to [facility] .    RN to call [phone] to give report prior to discharge. Pt going to Room 410.  Clinical Social Worker will sign off for now as social work intervention is no longer needed. Please consult [place] again if new need arises.  Elissa Hefty, LCSW Clinical Social Worker [phone]

## 2017-05-20 NOTE — Discharge Instructions (Signed)
Ophelia Charter MD, MPH [place] [address], [address] [phone] (tel)   [phone] (fax)   [place] REPLACEMENT    WOUND CARE ? You may leave the operative dressing in place until your follow-up appointment. ? KEEP THE INCISIONS CLEAN AND DRY. ? Use the Cryocuff, GameReady or Ice as often as possible for the first 3-4 days, then as needed for pain relief.  ? You may shower on Post-Op Day #2. The dressing is water resistant but do not scrub it as it may start to peel up.  You may remove the sling for showering, but keep a water resistant pillow under the arm to keep both the elbow and shoulder away from the body (mimicking the abduction sling). Gently pat the area dry. Do not soak the shoulder in water. Do not go swimming in the pool or ocean until your sutures are removed.  EXERCISES ? Wear the sling at all times except when doing your exercises. You may remove the sling for showering, but keep the arm across the chest or in a secondary sling.   Accidental/Purposeful External Rotation and shoulder flexion (reaching behind you) is to be avoided at all costs for the first month. ? Please perform the exercises:    Elbow / Hand / Wrist  Range of Motion Exercises POST-OP ? A multi-modal approach will be used to treat your pain.  Tramadol or Dilaudid - use as needed for pain  Meloxicam- An anti-inflammatory medication  Acetaminophen - A non-narcotic pain medicine.  Use 1000mg  three times a day for the first 14 days after surgery ? If you have any adverse effects with the medications, please call our office.  FOLLOW-UP ? If you develop a Fever (?101.5), Redness or Drainage from the surgical incision site, please call our office to arrange for an evaluation. ? Please call the office to schedule a follow-up appointment for a wound check, 7-10 days post-operatively.    IF YOU HAVE ANY QUESTIONS, PLEASE FEEL FREE TO CALL OUR  OFFICE.   HELPFUL INFORMATION  ? Your arm will be in a sling following surgery. You will be in this sling for the next 3-4 weeks.  I will let you know the exact duration at your follow-up visit.  ? You may be more comfortable sleeping in a semi-seated position the first few nights following surgery.  Keep a pillow propped under the elbow and forearm for comfort.  If you have a recliner type of chair it might be beneficial.  If not that is fine too, but it would be helpful to sleep propped up with pillows behind your operated shoulder as well under your elbow and forearm.  This will reduce pulling on the suture lines.  ? We suggest you use the pain medication the first night prior to going to bed, in order to ease any pain when the anesthesia wears off. You should avoid taking pain medications on an empty stomach as it will make you nauseous.  ? Do not drink alcoholic beverages or take illicit drugs when taking pain medications.  ? In most states it is against the law to drive while your arm is in a sling. And certainly against the law to drive while taking narcotics.  ? You may return to work/school in the next couple of days when you feel up to it. Desk work and typing in the sling is fine.  ? When dressing, put your operative arm in the sleeve first.  When getting undressed, take your operative arm out last.  Loose fitting, button-down shirts are recommended.  ? Pain medication may make you constipated.  Below are a few solutions to try in this order: - Decrease the amount of pain medication if you arent having pain. - Drink lots of decaffeinated fluids. - Drink prune juice and/or each dried prunes  o If the first 3 dont work start with additional solutions - Take Colace - an over-the-counter stool softener - Take Senokot - an over-the-counter laxative - Take Miralax - a stronger over-the-counter laxative

## 2017-05-20 NOTE — Progress Notes (Signed)
Occupational Therapy Re-evaluation Patient Details Name: Lori Krueger MRN: [id number] DOB: 1934/06/05 Today's Date: 05/20/2017    History of Present Illness s/p REVERSE TOTAL SHOULDER ARTHROPLASTY to repair fracture from fall sustained on 05/16/17. PMH of Dyslipidemia, Hypothyroidism, Osteoporosis, Skin cancer, and SVT (supraventricular tachycardia) ([place]).   Clinical Impression   Pt admitted with the above diagnoses and presents with below problem list. Pt will benefit from continued acute OT to address the below listed deficits and maximize independence with basic ADLs prior to d/c to next venue. PTA pt was independent with ADLs. Pt currently mod A with UB/LB ADLs, min A for functional transfers/mobility. Pt with nausea during session, nursing notified. Plan to see later today for e/w/h ROM and sling education. Pt lives alone. Recommending SNF at d/c for further rehab.     Follow Up Recommendations  SNF;Supervision/Assistance - 24 hour    Equipment Recommendations  3 in 1 bedside commode    Recommendations for Other Services PT consult     Precautions / Restrictions Precautions Precautions: Fall;Shoulder Shoulder Interventions: Shoulder sling/immobilizer;At all times;Off for dressing/bathing/exercises Precaution Booklet Issued: Yes (comment) Precaution Comments: sling ok to come off for bathing/dressing as long as RUE in supported position per MD  Required Braces or Orthoses: Sling Restrictions Weight Bearing Restrictions: Yes RUE Weight Bearing: Non weight bearing      Mobility Bed Mobility Overal bed mobility: Needs Assistance Bed Mobility: Sit to Supine       Sit to supine: Min assist;Min guard   General bed mobility comments: light assist given to BLE.   Transfers Overall transfer level: Needs assistance Equipment used: 1 person hand held assist Transfers: Sit to/from Stand Sit to Stand: Min guard;Min assist         General transfer comment: min A to steady.      Balance Overall balance assessment: Needs assistance Sitting-balance support: Feet supported Sitting balance-Leahy Scale: Good     Standing balance support: Single extremity supported;During functional activity Standing balance-Leahy Scale: Fair Standing balance comment: Pt could stand for brief moments without support. External support sought for dynamic tasks in standing                           ADL either performed or assessed with clinical judgement   ADL Overall ADL's : Needs assistance/impaired Eating/Feeding: Set up;Sitting   Grooming: Moderate assistance;Sitting   Upper Body Bathing: Moderate assistance;Sitting   Lower Body Bathing: Moderate assistance;Sit to/from stand   Upper Body Dressing : Moderate assistance;Sitting   Lower Body Dressing: Moderate assistance;Sit to/from stand   Toilet Transfer: Minimal assistance;Ambulation;Comfort height toilet;Grab bars Toilet Transfer Details (indicate cue type and reason): pt seeking external support during mobility Toileting- Clothing Manipulation and Hygiene: Min guard;Sitting/lateral lean   Tub/ Shower Transfer: Minimal assistance;Ambulation;3 in 1   Functional mobility during ADLs: Minimal assistance General ADL Comments: Pt completed in-room functional mobility, toilet trasnfer and bed mobility. Discussed UB ADL technique.      Vision         Perception     Praxis      Pertinent Vitals/Pain Pain Assessment: Faces Faces Pain Scale: Hurts even more Pain Location: R shoulder Pain Descriptors / Indicators: Aching;Grimacing;Guarding Pain Intervention(s): Limited activity within patient's tolerance;Repositioned;Monitored during session     Hand Dominance Left   Extremity/Trunk Assessment Upper Extremity Assessment Upper Extremity Assessment: RUE deficits/detail RUE Deficits / Details: s/p REVERSE TOTAL SHOULDER ARTHROPLASTY   Lower Extremity Assessment Lower Extremity Assessment: Defer  to  PT evaluation   Cervical / Trunk Assessment Cervical / Trunk Assessment: Kyphotic   Communication Communication Communication: No difficulties   Cognition Arousal/Alertness: Awake/alert Behavior During Therapy: WFL for tasks assessed/performed Overall Cognitive Status: Within Functional Limits for tasks assessed                                     General Comments  Pt with nausea.  Nursing notified.    Exercises     Shoulder Instructions      Home Living Family/patient expects to be discharged to:: Skilled nursing facility Living Arrangements: Alone                                      Prior Functioning/Environment Level of Independence: Independent        Comments: very active, independent, volunteers here        OT Problem List: Decreased strength;Decreased range of motion;Impaired balance (sitting and/or standing);Decreased knowledge of use of DME or AE;Decreased knowledge of precautions;Impaired UE functional use;Pain      OT Treatment/Interventions: Self-care/ADL training;DME and/or AE instruction;Therapeutic activities    OT Goals(Current goals can be found in the care plan section) Acute Rehab OT Goals Patient Stated Goal: recover and back to independence OT Goal Formulation: With patient Time For Goal Achievement: 06/03/17 Potential to Achieve Goals: Good ADL Goals Pt Will Perform Upper Body Bathing: (P) with modified independence;sitting Pt Will Perform Upper Body Dressing: (P) with modified independence;sitting Pt Will Perform Lower Body Dressing: (P) with supervision;sit to/from stand  OT Frequency: Min 3X/week   Barriers to D/C: Decreased caregiver support          Co-evaluation              AM-PAC PT "6 Clicks" Daily Activity     Outcome Measure Help from another person eating meals?: A Little Help from another person taking care of personal grooming?: A Lot Help from another person toileting, which  includes using toliet, bedpan, or urinal?: A Little Help from another person bathing (including washing, rinsing, drying)?: A Lot Help from another person to put on and taking off regular upper body clothing?: A Lot Help from another person to put on and taking off regular lower body clothing?: A Lot 6 Click Score: 14   End of Session Equipment Utilized During Treatment: Gait belt;Rolling walker;Other (comment)(sling) Nurse Communication: Other (comment)(Grandaughter called RN regarding nausea. )  Activity Tolerance: Other (comment)(nausea at midpoint of session) Patient left: in bed;with call bell/phone within reach;with family/visitor present  OT Visit Diagnosis: Unsteadiness on feet (R26.81);History of falling (Z91.81);Dizziness and giddiness (R42);Pain Pain - Right/Left: Right Pain - part of body: Shoulder                Time: 5366-4403 OT Time Calculation (min): 29 min Charges:  OT General Charges $OT Visit: 1 Visit OT Evaluation $OT Re-eval: 1 Re-eval OT Treatments $Self Care/Home Management : 8-22 mins G-Codes:       Hortencia Pilar 05/20/2017, 11:23 AM

## 2017-05-20 NOTE — Progress Notes (Signed)
Report called to [facility] regarding the patient's discharge.  Patient's family has made a scheduled appointment for follow up.  The patient and family verbalizes understanding discharge instruction.  The verbal report was given to Diane at [facility].

## 2017-05-20 NOTE — Progress Notes (Addendum)
ORTHOPAEDIC PROGRESS NOTE  s/p Procedure(s): REVERSETOTAL SHOULDER ARTHROPLASTY  SUBJECTIVE: Reports mild pain about operative site. No chest pain. No SOB. No nausea/vomiting. No other complaints.  OBJECTIVE: PE:  RUE: Dressing CDI and sling well fitting,  full and painless ROM throughout hand with DPC of 0. + Motor in  AIN, PIN, Ulnar distributions. Axillary nerve sensation preserved and symmetric.  Sensation intact in medial, radial, and ulnar distributions. Well perfused digits.    Vitals:   05/19/17 2354 05/20/17 0625  BP: 117/62 (!) 165/77  Pulse: 71 64  Resp: 17   Temp: 98.6 F (37 C) 97.6 F (36.4 C)  SpO2: 92% 94%     ASSESSMENT: Lori Krueger is a 83 y.o. female doing well postoperatively.  PLAN: Weightbearing: NWB RUE Insicional and dressing care: Dressings left intact until follow-up Orthopedic device(s): Sling with pillow Showering: OK to shower with arm supported, either with other hand or in sling.   VTE prophylaxis: SCDs, phamacologic VTE prophylaxis not indicated in ambulatory upper extremity surgery patient.  Pain control: Patient is trying to avoid narcotics but counseled that may need some narcotic today.  Tramadol, celebrex, tylenol otherwise. Follow - up plan: 1 week Contact information:  Weekdays 8-5 Ophelia Charter MD [phone], After hours and holidays please check [email or web address] for group call information for [facility]  OK for discharge today as long as pain relatively controlled.  DC instructions placed already, defer to hospitalist and case management on dispo location, likely snf

## 2017-05-20 NOTE — Discharge Summary (Signed)
Physician Discharge Summary  Bryna Razavi  [id number]  DOB: 1934/08/23  DOA: 05/16/2017 PCP: Haywood Pao, MD  Admit date: 05/16/2017 Discharge date: 05/20/2017  Admitted From: Home  Disposition:  SNF   Recommendations for Outpatient Follow-up:  1. Follow up with SNF provider at earliest convenience  2. Please obtain BMP/CBC in one week to monitor Hgb and Cr   Discharge Condition: Stable  CODE STATUS: Full Code  Diet recommendation: Heart Healthy   Brief/Interim Summary: For full details see H&P/progress note but in brief, Sherlynn Tourville is a 83 year old female with medical history significant for hypertension who sustained a mechanical fall and injured her right shoulder.  Upon evaluation was supposed to have right humeral comminuted fracture.  Patient also was found to be mildly hypoxic which is spontaneously resolve and felt to be related to sedating medications.  Patient underwent on right shoulder arthroplasty which was uneventful.  Patient has good recovery post op, PT has evaluated the patient and recommended short-term rehab.  Patient was cleared by orthopedic surgery and deemed to be stable for discharge.  Subjective: Patient seen and examined with granddaughter at bedside, she has no complaints this morning.  Pain is well controlled, no acute events overnight.  Good range of movement  Discharge Diagnoses/Hospital Course:  Right humeral comminuted fracture Status post right shoulder hemiarthroplasty Rehab at SNF  Hypertension BP remained stable during hospital stay Continue home medications without changes at this time  Leukocytosis Suspect to be reactive from trauma and surgery No signs of infection Check CBC in 1 week  Hypoxia Resolved Felt to be related to sedating medications while in the ED Chest x-ray negative from pro-calcitonin normal  All other chronic medical condition were stable during the hospitalization.  Patient was seen by physical therapy,  Recommending SNIF On the day of the discharge the patient's vitals were stable, and no other acute medical condition were reported by patient. the patient was felt safe to be discharge to SNF  Discharge Instructions  You were cared for by a hospitalist during your hospital stay. If you have any questions about your discharge medications or the care you received while you were in the hospital after you are discharged, you can call the unit and asked to speak with the hospitalist on call if the hospitalist that took care of you is not available. Once you are discharged, your primary care physician will handle any further medical issues. Please note that NO REFILLS for any discharge medications will be authorized once you are discharged, as it is imperative that you return to your primary care physician (or establish a relationship with a primary care physician if you do not have one) for your aftercare needs so that they can reassess your need for medications and monitor your lab values.  Discharge Instructions    Call MD for:  difficulty breathing, headache or visual disturbances   Complete by:  As directed    Call MD for:  extreme fatigue   Complete by:  As directed    Call MD for:  hives   Complete by:  As directed    Call MD for:  persistant dizziness or light-headedness   Complete by:  As directed    Call MD for:  persistant nausea and vomiting   Complete by:  As directed    Call MD for:  redness, tenderness, or signs of infection (pain, swelling, redness, odor or green/yellow discharge around incision site)   Complete by:  As directed  Call MD for:  severe uncontrolled pain   Complete by:  As directed    Call MD for:  temperature >100.4   Complete by:  As directed    Diet - low sodium heart healthy   Complete by:  As directed    Increase activity slowly   Complete by:  As directed      Allergies as of 05/20/2017      Reactions   Codeine Nausea And Vomiting   Lactose Intolerance  (gi) Diarrhea      Medication List    TAKE these medications   acetaminophen 500 MG tablet Commonly known as:  TYLENOL Take 500 mg by mouth every 6 (six) hours as needed (for pain).   aspirin EC 81 MG tablet Take 81 mg by mouth daily.   celecoxib 200 MG capsule Commonly known as:  CELEBREX Take 1 capsule (200 mg total) by mouth every 12 (twelve) hours.   levothyroxine 100 MCG tablet Commonly known as:  SYNTHROID, LEVOTHROID Take 100 mcg by mouth See admin instructions. 100 mcg in the morning on Mon/Tues/Wed/Thurs/Fri/Sat, skip Sundays   metoprolol succinate 25 MG 24 hr tablet Commonly known as:  TOPROL-XL Take 25 mg by mouth daily.   multivitamins ther. w/minerals Tabs tablet Take 1 tablet by mouth daily.   VESICARE 5 MG tablet Generic drug:  solifenacin Take 1 tablet by mouth daily.   Vitamin D-3 1000 units Caps Take 1,000 Units by mouth daily.       Contact information for follow-up providers    Hiram Gash, MD. Schedule an appointment as soon as possible for a visit in 1 week(s).   Specialty:  Orthopedic Surgery Contact information: [address] [place] [address] [phone]            Contact information for after-discharge care    Destination    [facility] Follow up.   Service:  Skilled Nursing Contact information: [place] [address] [phone]                 Allergies  Allergen Reactions  . Codeine Nausea And Vomiting  . Lactose Intolerance (Gi) Diarrhea    Consultations:  Orthopedic surgery   Procedures/Studies: Dg Shoulder 1v Right  Result Date: 05/19/2017 CLINICAL DATA:  RIGHT shoulder replacement EXAM: DG C-ARM 61-120 MIN; RIGHT SHOULDER - 2 VIEW COMPARISON:  CT RIGHT shoulder 05/16/2017 FLUOROSCOPY TIME:  0 minutes 7 seconds Images obtained: 2 FINDINGS: First image demonstrates a comminuted displaced fracture of the proximal RIGHT humerus involving the surgical  neck, head and greater tuberosity. Dislocation of the RIGHT humeral head. AC joint alignment normal. Bones diffusely demineralized. Second image demonstrates placement of a reverse RIGHT shoulder prosthesis without fracture or dislocation on single AP view. IMPRESSION: Reverse RIGHT shoulder arthroplasty for comminuted fracture dislocation of the proximal RIGHT humerus. Electronically Signed   By: Lavonia Dana M.D.   On: 05/19/2017 12:49   Dg Shoulder Right  Result Date: 05/16/2017 CLINICAL DATA:  [place] on RIGHT side, severe RIGHT shoulder pain, pain with palpation EXAM: RIGHT SHOULDER - 2+ VIEW COMPARISON:  None FINDINGS: Osseous demineralization. AC joint alignment normal. Fracture dislocation of the RIGHT humeral head/neck including a displaced greater tuberosity fragment. Inferior dislocation of the RIGHT humeral head. Visualized ribs intact. No additional fractures identified. IMPRESSION: Comminuted fracture of the proximal RIGHT humerus with inferior RIGHT glenohumeral dislocation. Electronically Signed   By: Lavonia Dana M.D.   On: 05/16/2017 12:55  Ct Head Wo Contrast  Result Date: 05/16/2017 CLINICAL DATA:  Fall, right eye laceration EXAM: CT HEAD WITHOUT CONTRAST TECHNIQUE: Contiguous axial images were obtained from the base of the skull through the vertex without intravenous contrast. COMPARISON:  08/16/2016 FINDINGS: Brain: No evidence of acute infarction, hemorrhage, hydrocephalus, extra-axial collection or mass lesion/mass effect. Mild subcortical white matter and periventricular small vessel ischemic changes. Vascular: No hyperdense vessel or unexpected calcification. Skull: Normal. Negative for fracture or focal lesion. Sinuses/Orbits: The visualized paranasal sinuses are essentially clear. The mastoid air cells are unopacified. Other: Mild soft tissue swelling/hematoma along the lateral aspect of the right orbit/zygoma (series 3/ image 12). IMPRESSION: Mild soft swelling/hematoma along the  lateral aspect of the right orbit/ zygoma. No evidence of acute intracranial abnormality. Mild small vessel ischemic changes. Electronically Signed   By: Julian Hy M.D.   On: 05/16/2017 14:56   Ct Shoulder Right Wo Contrast  Result Date: 05/16/2017 CLINICAL DATA:  Right shoulder fracture. EXAM: CT OF THE UPPER RIGHT EXTREMITY WITHOUT CONTRAST TECHNIQUE: Multidetector CT imaging of the upper right extremity was performed according to the standard protocol. COMPARISON:  None. FINDINGS: Bones/Joint/Cartilage Acute comminuted fracture of the surgical neck of the right proximal humerus. Comminuted fracture of the superior lateral humeral head involving the greater and lesser tuberosities. Greater tuberosity is peripherally displaced 19 mm. Two fracture fragments are rotated approximately 90 degrees. Humeral head is anterior and inferiorly dislocated relative to the glenoid. Large amount of fluid in the subacromial/subdeltoid bursa with high density material dependently likely reflecting hemorrhagic bursitis. Moderate joint effusion. No other acute fracture or dislocation. No joint effusion. Ligaments Ligaments are suboptimally evaluated by CT. Muscles and Tendons No muscle atrophy. Soft tissue No fluid collection or hematoma. No soft tissue mass. Left lower lobe airspace disease may reflect atelectasis versus pneumonia. IMPRESSION: 1. Comminuted and displaced fracture of the surgical neck and head of the humerus. Anterior inferior dislocation of the humeral head relative to the glenoid. Large subacromial/subdeltoid hemorrhagic bursitis. 2. Right lower lobe airspace disease which may reflect atelectasis versus pneumonia. Electronically Signed   By: Kathreen Devoid   On: 05/16/2017 17:59   Dg Chest Port 1 View  Result Date: 05/16/2017 CLINICAL DATA:  Pt states she has had a dry cough at night for the past couple of days after a cold. Unable to remove bra due to pt being immobilized in sling for right shoulder.  Pt states "she has had all the pain she can take." EXAM: PORTABLE CHEST 1 VIEW COMPARISON:  05/16/2017, 04/19/2014 FINDINGS: Shallow lung inflation. Heart size is accentuated by low lung volumes. There are no focal consolidations or pleural effusions. No pulmonary edema. There is minimal bibasilar atelectasis. Comminuted, displaced right shoulder fracture. IMPRESSION: No evidence for acute cardiopulmonary abnormality. Electronically Signed   By: Nolon Nations M.D.   On: 05/16/2017 20:03   Dg Shoulder Right Port  Result Date: 05/19/2017 CLINICAL DATA:  Right shoulder arthroplasty. EXAM: PORTABLE RIGHT SHOULDER COMPARISON:  05/19/2017 FINDINGS: Well seated components of a reversed right shoulder arthroplasty. Fracture involving the superior cortex of the humerus versus osteotomy defect. Recommend clinical correlation. IMPRESSION: Well seated components of a reversed right shoulder arthroplasty. Fracture versus osteotomy defect along the superior cortex of the proximal humerus. Electronically Signed   By: Marijo Sanes M.D.   On: 05/19/2017 15:16   Dg Shoulder Right Portable  Result Date: 05/16/2017 CLINICAL DATA:  Post reduction. EXAM: PORTABLE RIGHT SHOULDER COMPARISON:  May 16, 2017 FINDINGS: A comminuted  fracture of the right humeral head/neck persists. There is increased displacement of the humeral head. The humeral head remains dislocated anteriorly and inferiorly. No other changes. IMPRESSION: Comminuted fracture of the proximal right humerus as above with increased displacement of the humeral head. Continued dislocation of the humeral head. Electronically Signed   By: Dorise Bullion III M.D   On: 05/16/2017 16:19   Dg C-arm 1-60 Min  Result Date: 05/19/2017 CLINICAL DATA:  RIGHT shoulder replacement EXAM: DG C-ARM 61-120 MIN; RIGHT SHOULDER - 2 VIEW COMPARISON:  CT RIGHT shoulder 05/16/2017 FLUOROSCOPY TIME:  0 minutes 7 seconds Images obtained: 2 FINDINGS: First image demonstrates a comminuted  displaced fracture of the proximal RIGHT humerus involving the surgical neck, head and greater tuberosity. Dislocation of the RIGHT humeral head. AC joint alignment normal. Bones diffusely demineralized. Second image demonstrates placement of a reverse RIGHT shoulder prosthesis without fracture or dislocation on single AP view. IMPRESSION: Reverse RIGHT shoulder arthroplasty for comminuted fracture dislocation of the proximal RIGHT humerus. Electronically Signed   By: Lavonia Dana M.D.   On: 05/19/2017 12:49    Discharge Exam: Vitals:   05/20/17 0625 05/20/17 1117  BP: (!) 165/77 (!) 151/54  Pulse: 64 (!) 59  Resp:  16  Temp: 97.6 F (36.4 C)   SpO2: 94% 99%   Vitals:   05/19/17 1953 05/19/17 2354 05/20/17 0625 05/20/17 1117  BP: (!) 103/53 117/62 (!) 165/77 (!) 151/54  Pulse: 78 71 64 (!) 59  Resp: 18 17  16   Temp: 97.7 F (36.5 C) 98.6 F (37 C) 97.6 F (36.4 C)   TempSrc: Oral Oral Oral Oral  SpO2: 93% 92% 94% 99%  Weight:      Height:        General: Pt is alert, awake, not in acute distress Cardiovascular: RRR, S1/S2 +, no rubs, no gallops Respiratory: CTA bilaterally, no wheezing, no rhonchi Extremities: no edema, R arm on a sling, ok ROM    The results of significant diagnostics from this hospitalization (including imaging, microbiology, ancillary and laboratory) are listed below for reference.     Microbiology: Recent Results (from the past 240 hour(s))  Surgical PCR screen     Status: None   Collection Time: 05/17/17  1:10 AM  Result Value Ref Range Status   MRSA, PCR NEGATIVE NEGATIVE Final   Staphylococcus aureus NEGATIVE NEGATIVE Final    Comment: (NOTE) The Xpert SA Assay (FDA approved for NASAL specimens in patients 24 years of age and older), is one component of a comprehensive surveillance program. It is not intended to diagnose infection nor to guide or monitor treatment.      Labs: BNP (last 3 results) No results for input(s): BNP in the last  8760 hours. Basic Metabolic Panel: Recent Labs  Lab 05/16/17 1359 05/16/17 1854 05/18/17 0642 05/20/17 0500  NA 140 135 134* 136  K 4.0 4.5 3.7 4.2  CL 100* 99* 99* 103  CO2  --  25 27 22   GLUCOSE 164* 145* 129* 129*  BUN 19 17 17 17   CREATININE 0.70 0.64 0.69 0.73  CALCIUM  --  8.8* 8.9 8.2*   Liver Function Tests: No results for input(s): AST, ALT, ALKPHOS, BILITOT, PROT, ALBUMIN in the last 168 hours. No results for input(s): LIPASE, AMYLASE in the last 168 hours. No results for input(s): AMMONIA in the last 168 hours. CBC: Recent Labs  Lab 05/16/17 1359 05/16/17 1854 05/18/17 0642 05/20/17 0500  WBC  --  14.7* 12.7*  12.3*  NEUTROABS  --  13.6*  --   --   HGB 12.9 12.1 10.8* 9.0*  HCT 38.0 37.2 33.0* 27.8*  MCV  --  97.4 97.6 97.2  PLT  --  294 258 173   Cardiac Enzymes: No results for input(s): CKTOTAL, CKMB, CKMBINDEX, TROPONINI in the last 168 hours. BNP: Invalid input(s): POCBNP CBG: No results for input(s): GLUCAP in the last 168 hours. D-Dimer No results for input(s): DDIMER in the last 72 hours. Hgb A1c No results for input(s): HGBA1C in the last 72 hours. Lipid Profile No results for input(s): CHOL, HDL, LDLCALC, TRIG, CHOLHDL, LDLDIRECT in the last 72 hours. Thyroid function studies No results for input(s): TSH, T4TOTAL, T3FREE, THYROIDAB in the last 72 hours.  Invalid input(s): FREET3 Anemia work up No results for input(s): VITAMINB12, FOLATE, FERRITIN, TIBC, IRON, RETICCTPCT in the last 72 hours. Urinalysis No results found for: COLORURINE, APPEARANCEUR, [place], [place], [place], [place], [place], [place], PROTEINUR, UROBILINOGEN, NITRITE, LEUKOCYTESUR Sepsis Labs Invalid input(s): PROCALCITONIN,  WBC,  LACTICIDVEN Microbiology Recent Results (from the past 240 hour(s))  Surgical PCR screen     Status: None   Collection Time: 05/17/17  1:10 AM  Result Value Ref Range Status   MRSA, PCR NEGATIVE NEGATIVE Final   Staphylococcus aureus  NEGATIVE NEGATIVE Final    Comment: (NOTE) The Xpert SA Assay (FDA approved for NASAL specimens in patients 35 years of age and older), is one component of a comprehensive surveillance program. It is not intended to diagnose infection nor to guide or monitor treatment.     Time coordinating discharge: 32 minutes  SIGNED:  Chipper Oman, MD  [facility] 05/20/2017, 1:10 PM  Pager please text page via  [email or web address]

## 2017-05-21 ENCOUNTER — Encounter: Payer: Self-pay | Admitting: Student

## 2017-05-21 DIAGNOSIS — S4291XA Fracture of right shoulder girdle, part unspecified, initial encounter for closed fracture: Secondary | ICD-10-CM | POA: Insufficient documentation

## 2017-05-21 DIAGNOSIS — M810 Age-related osteoporosis without current pathological fracture: Secondary | ICD-10-CM | POA: Insufficient documentation

## 2017-05-21 DIAGNOSIS — S42293A Other displaced fracture of upper end of unspecified humerus, initial encounter for closed fracture: Secondary | ICD-10-CM | POA: Insufficient documentation

## 2017-07-09 ENCOUNTER — Other Ambulatory Visit (HOSPITAL_COMMUNITY): Payer: Self-pay | Admitting: *Deleted

## 2017-07-12 ENCOUNTER — Ambulatory Visit (HOSPITAL_COMMUNITY)
Admission: RE | Admit: 2017-07-12 | Discharge: 2017-07-12 | Disposition: A | Discharge status: Home / Self Care | Payer: Medicare Other | Source: Ambulatory Visit | Attending: Internal Medicine | Admitting: Internal Medicine

## 2017-07-12 DIAGNOSIS — M810 Age-related osteoporosis without current pathological fracture: Secondary | ICD-10-CM | POA: Insufficient documentation

## 2017-07-12 MED ORDER — DENOSUMAB 60 MG/ML ~~LOC~~ SOLN
60.0000 mg | Freq: Once | SUBCUTANEOUS | Status: AC
  Administered 2017-07-12: 60 mg via SUBCUTANEOUS
  Filled 2017-07-12: qty 1

## 2018-06-29 ENCOUNTER — Other Ambulatory Visit (HOSPITAL_COMMUNITY): Payer: Self-pay | Admitting: *Deleted

## 2018-06-30 ENCOUNTER — Ambulatory Visit (HOSPITAL_COMMUNITY)
Admission: RE | Admit: 2018-06-30 | Discharge: 2018-06-30 | Disposition: A | Discharge status: Home / Self Care | Payer: Medicare Other | Source: Ambulatory Visit | Attending: Internal Medicine | Admitting: Internal Medicine

## 2018-06-30 ENCOUNTER — Encounter (HOSPITAL_COMMUNITY): Payer: Medicare Other

## 2018-06-30 DIAGNOSIS — M810 Age-related osteoporosis without current pathological fracture: Secondary | ICD-10-CM | POA: Insufficient documentation

## 2018-06-30 MED ORDER — DENOSUMAB 60 MG/ML ~~LOC~~ SOSY
PREFILLED_SYRINGE | SUBCUTANEOUS | Status: AC
  Administered 2018-06-30: 10:00:00 60 mg via SUBCUTANEOUS
  Filled 2018-06-30: qty 1

## 2018-06-30 MED ORDER — DENOSUMAB 60 MG/ML ~~LOC~~ SOSY
60.0000 mg | PREFILLED_SYRINGE | Freq: Once | SUBCUTANEOUS | Status: AC
  Administered 2018-06-30: 60 mg via SUBCUTANEOUS

## 2019-01-11 ENCOUNTER — Other Ambulatory Visit (HOSPITAL_COMMUNITY): Payer: Self-pay | Admitting: *Deleted

## 2019-01-12 ENCOUNTER — Ambulatory Visit (HOSPITAL_COMMUNITY)
Admission: RE | Admit: 2019-01-12 | Discharge: 2019-01-12 | Disposition: A | Discharge status: Home / Self Care | Payer: Medicare Other | Source: Ambulatory Visit | Attending: Internal Medicine | Admitting: Internal Medicine

## 2019-01-12 ENCOUNTER — Other Ambulatory Visit: Payer: Self-pay

## 2019-01-12 DIAGNOSIS — M810 Age-related osteoporosis without current pathological fracture: Secondary | ICD-10-CM | POA: Diagnosis not present

## 2019-01-12 MED ORDER — DENOSUMAB 60 MG/ML ~~LOC~~ SOSY
PREFILLED_SYRINGE | SUBCUTANEOUS | Status: AC
  Filled 2019-01-12: qty 1

## 2019-01-12 MED ORDER — DENOSUMAB 60 MG/ML ~~LOC~~ SOSY
60.0000 mg | PREFILLED_SYRINGE | Freq: Once | SUBCUTANEOUS | Status: AC
  Administered 2019-01-12: 14:00:00 60 mg via SUBCUTANEOUS

## 2019-05-28 ENCOUNTER — Ambulatory Visit: Payer: Medicare PPO | Attending: Internal Medicine

## 2019-05-28 ENCOUNTER — Encounter: Payer: Self-pay | Admitting: *Deleted

## 2019-05-28 DIAGNOSIS — Z23 Encounter for immunization: Secondary | ICD-10-CM | POA: Insufficient documentation

## 2019-05-28 NOTE — Progress Notes (Unsigned)
   Covid-19 Vaccination Clinic  Name:  SARAIH GRIESEMER    MRN: [id number] DOB: 05/21/1934  05/28/2019  Ms. Guile was observed post Covid-19 immunization for 15 minutes without incidence. She was provided with Vaccine Information Sheet and instruction to access the V-Safe system.   Ms. Krummen was instructed to call 911 with any severe reactions post vaccine: Marland Kitchen Difficulty breathing  . Swelling of your face and throat  . A fast heartbeat  . A bad rash all over your body  . Dizziness and weakness

## 2019-06-16 ENCOUNTER — Ambulatory Visit: Payer: Medicare PPO | Attending: Internal Medicine

## 2019-06-16 DIAGNOSIS — Z23 Encounter for immunization: Secondary | ICD-10-CM | POA: Insufficient documentation

## 2019-06-16 NOTE — Progress Notes (Signed)
   Covid-19 Vaccination Clinic  Name:  Lori Krueger    MRN: [id number] DOB: December 17, 1934  06/16/2019  Lori Krueger was observed post Covid-19 immunization for 15 minutes without incidence. She was provided with Vaccine Information Sheet and instruction to access the V-Safe system.   Lori Krueger was instructed to call 911 with any severe reactions post vaccine: Marland Kitchen Difficulty breathing  . Swelling of your face and throat  . A fast heartbeat  . A bad rash all over your body  . Dizziness and weakness    Immunizations Administered    Name Date Dose VIS Date Route   Pfizer COVID-19 Vaccine 06/16/2019 10:13 AM 0.3 mL 04/21/2019 Intramuscular   Manufacturer: [place]   Lot: [id number]   [place]: [id number]

## 2019-07-24 ENCOUNTER — Ambulatory Visit (INDEPENDENT_AMBULATORY_CARE_PROVIDER_SITE_OTHER): Payer: Medicare PPO

## 2019-07-24 ENCOUNTER — Other Ambulatory Visit: Payer: Self-pay | Admitting: Podiatrist

## 2019-07-24 ENCOUNTER — Ambulatory Visit: Payer: Medicare PPO | Admitting: Podiatrist

## 2019-07-24 ENCOUNTER — Encounter: Payer: Self-pay | Admitting: Podiatrist

## 2019-07-24 ENCOUNTER — Other Ambulatory Visit: Payer: Self-pay

## 2019-07-24 VITALS — BP 154/87 | HR 65 | Temp 97.7°F

## 2019-07-24 DIAGNOSIS — M7751 Other enthesopathy of right foot: Secondary | ICD-10-CM | POA: Diagnosis not present

## 2019-07-24 DIAGNOSIS — M779 Enthesopathy, unspecified: Secondary | ICD-10-CM | POA: Diagnosis not present

## 2019-07-24 DIAGNOSIS — M19079 Primary osteoarthritis, unspecified ankle and foot: Secondary | ICD-10-CM | POA: Diagnosis not present

## 2019-07-24 DIAGNOSIS — M79672 Pain in left foot: Secondary | ICD-10-CM | POA: Diagnosis not present

## 2019-07-24 DIAGNOSIS — M79671 Pain in right foot: Secondary | ICD-10-CM | POA: Diagnosis not present

## 2019-07-24 DIAGNOSIS — M19072 Primary osteoarthritis, left ankle and foot: Secondary | ICD-10-CM

## 2019-07-24 NOTE — Patient Instructions (Signed)
I will call in a presciption for a cream at [facility]-    Also keep soaking in epsom salt soaks and continue with ibuprofen as needed-  New walking shoes would also help- be sure to skip the laces on the prominent part of your foot.

## 2019-07-25 ENCOUNTER — Encounter: Payer: Self-pay | Admitting: Podiatrist

## 2019-07-25 ENCOUNTER — Telehealth: Payer: Self-pay | Admitting: *Deleted

## 2019-07-25 MED ORDER — NONFORMULARY OR COMPOUNDED ITEM: 5 refills | Status: AC

## 2019-07-25 NOTE — Progress Notes (Signed)
  Chief Complaint  Patient presents with  . arithritis    B/L top of feet w/ right worse,has been hurting since 2-3 days     HPI: Patient is 85 y.o. female who presents today for pain on the top of both feet with the right being more painful than the left.  She states it has especially been hurting in the last 2 to 3 days and she denies any trauma or injury.  Patient states that ibuprofen has been helping and she is been wrapping an ice pack and now is a little bit better.   Review of Systems  DATA OBTAINED: from patient  GENERAL: Feels well no fevers, no fatigue, no changes in appetite SKIN: No itching, no rashes, no open wounds EYES: No eye pain,no redness, no discharge EARS: No earache,no ringing of ears, NOSE: No congestion, no drainage, no bleeding  MOUTH/THROAT: No mouth pain, No sore throat, No difficulty chewing or swallowing  RESPIRATORY: No cough, no wheezing, no SOB CARDIAC: No chest pain,no heart palpitations, GI: No abdominal pain, No Nausea, no vomiting, no diarrhea, no heartburn or no reflux  GU: No dysuria, no increased frequency or urgency MUSCULOSKELETAL: No unrelieved bone/joint pain,  NEUROLOGIC: Awake, alert, appropriate to situation, No change in mental status. PSYCHIATRIC: No overt anxiety or sadness.No behavior issue.      Physical Exam  GENERAL APPEARANCE: Alert, conversant. Appropriately groomed. No acute distress.   VASCULAR: Pedal pulses palpable DP and PT bilateral.  Capillary refill time is immediate to all digits,  Proximal to distal cooling it warm to warm.  Digital hair growth is present bilateral   NEUROLOGIC: sensation is intact epicritically and protectively to 5.07 monofilament at 5/5 sites bilateral.  Light touch is intact bilateral, vibratory sensation intact bilateral, achilles tendon reflex is intact bilateral.   MUSCULOSKELETAL: acceptable muscle strength, tone and stability bilateral.  Intrinsic muscluature intact bilateral.  Range of  motion at ankle and first MPJ is normal bilateral.  Dorsal spurring at the midfoot is palpated right foot.  Discomfort with palpation of the midfoot bilateral feet is noted right being more symptomatic than the left.  DERMATOLOGIC: skin is warm, supple, and dry.  No open lesions noted.  No interdigital maceration noted bilateral.   X-ray evaluation: 3 views of bilateral feet are obtained.  Notable spur at the first metatarsal base medial cuneiform and metatarsal cuneiform joints is noted on the right foot x-ray especially in the lateral film.  Mild spurring is noted on the left foot midfoot as well.  No acute osseous abnormalities are present-no sign of stress fracture present.   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    Plan  Discussed treatment options and alternatives.  Recommended shoe gear changes and lacing pattern changes to offload the painful spur area.  Also discussed ordering a pain cream from [facility] for her to try as well.  If these treatments fail to relieve her pain she will be seen back as needed for follow-up.

## 2019-07-25 NOTE — Telephone Encounter (Signed)
Faxed orders to [facility]. 

## 2019-07-25 NOTE — Telephone Encounter (Signed)
-----   Message from Bronson Ing, [place] sent at 07/25/2019 12:59 PM EDT ----- Regarding: [facility] rx? Hi Val!  [place] seeing you yesterday!  Quick ?Marland Kitchen.. are you still calling in the [facility] scripts for patients?  If so, I was wanting to get pain cream #18 for this patient at 100gm tube?  If that's the standard size?   Her Rx is a painful osteoarthritis - midfoot bilateral feet.  If you aren't the correct recipient of this request just let me know and I"ll pass it on!  Thank You!!  Dr. Johnette Abraham

## 2019-09-06 ENCOUNTER — Other Ambulatory Visit (HOSPITAL_COMMUNITY): Payer: Self-pay

## 2019-09-07 ENCOUNTER — Ambulatory Visit (HOSPITAL_COMMUNITY)
Admission: RE | Admit: 2019-09-07 | Discharge: 2019-09-07 | Disposition: A | Discharge status: Home / Self Care | Payer: Medicare PPO | Source: Ambulatory Visit | Attending: Internal Medicine | Admitting: Internal Medicine

## 2019-09-07 ENCOUNTER — Other Ambulatory Visit: Payer: Self-pay

## 2019-09-07 DIAGNOSIS — M810 Age-related osteoporosis without current pathological fracture: Secondary | ICD-10-CM | POA: Insufficient documentation

## 2019-09-07 MED ORDER — DENOSUMAB 60 MG/ML ~~LOC~~ SOSY
PREFILLED_SYRINGE | SUBCUTANEOUS | Status: AC
  Administered 2019-09-07: 60 mg via SUBCUTANEOUS
  Filled 2019-09-07: qty 1

## 2019-09-07 MED ORDER — DENOSUMAB 60 MG/ML ~~LOC~~ SOSY: 60.0000 mg | PREFILLED_SYRINGE | Freq: Once | SUBCUTANEOUS | Status: AC

## 2019-12-14 IMAGING — CT CT HEAD W/O CM
3 of 4 series · 15 of 47 positions shown, 18 images · non-contrast
Comparison: 08/16/2016

CLINICAL DATA: Fall, right eye laceration

EXAM:
CT HEAD WITHOUT CONTRAST
TECHNIQUE: Contiguous axial images were obtained from the base of the skull
through the vertex without intravenous contrast.

[Series 4: head 2.0 h70h · axial · 0.39mm/px · z∈[-129,-1]mm · 9 of 82 slices shown, 12 images]
[im 9/82  brain]
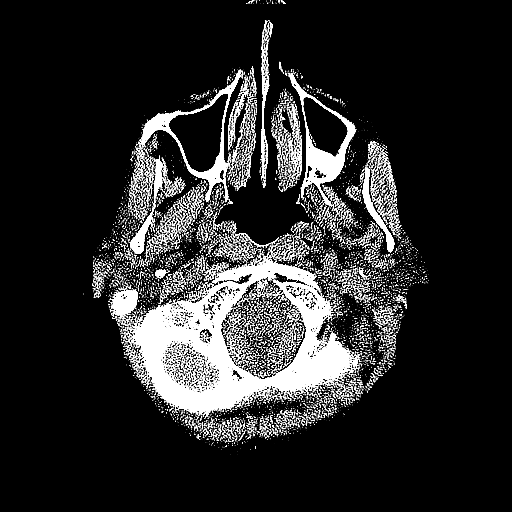
[im 9/82  bone]
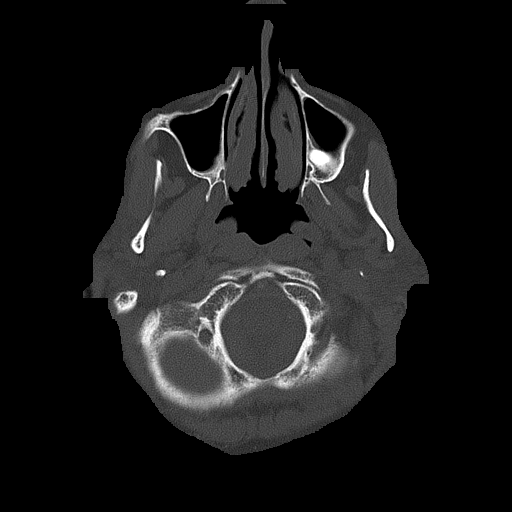
[im 17/82  brain]
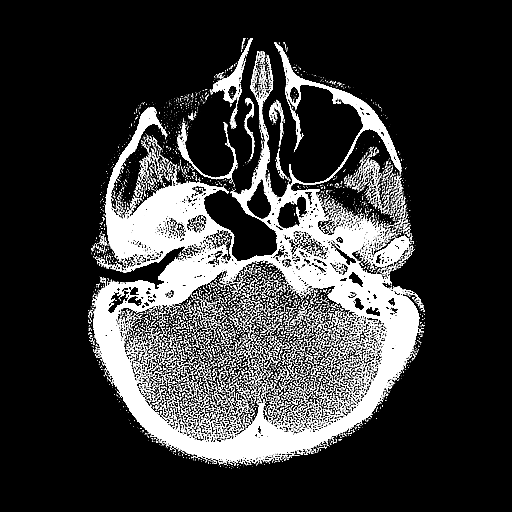
[im 25/82  brain]
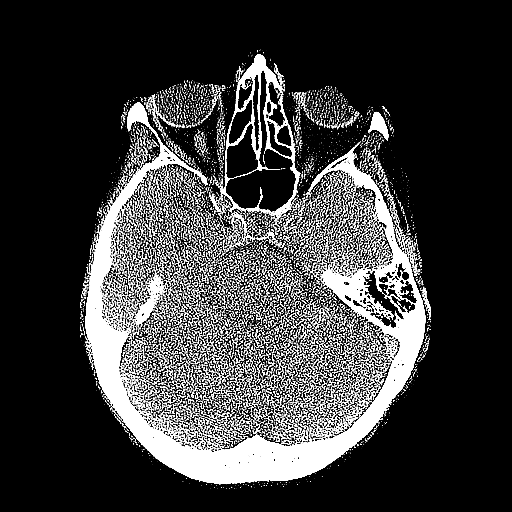
[im 33/82  brain]
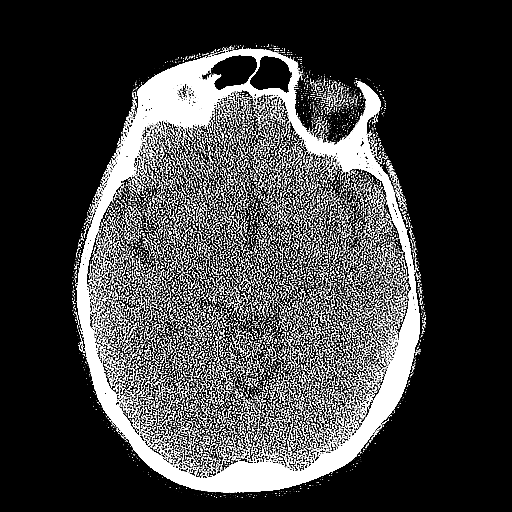
[im 41/82  brain]
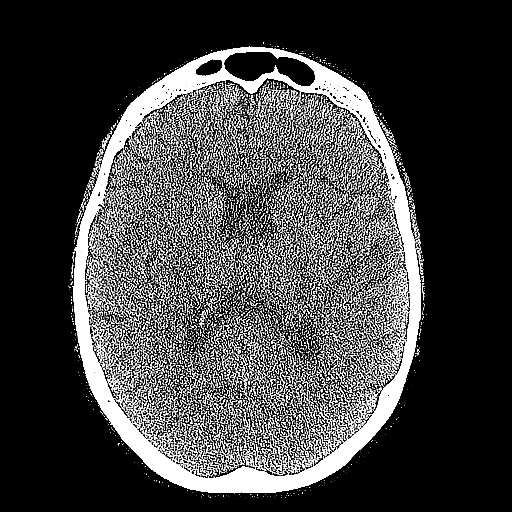
[im 41/82  bone]
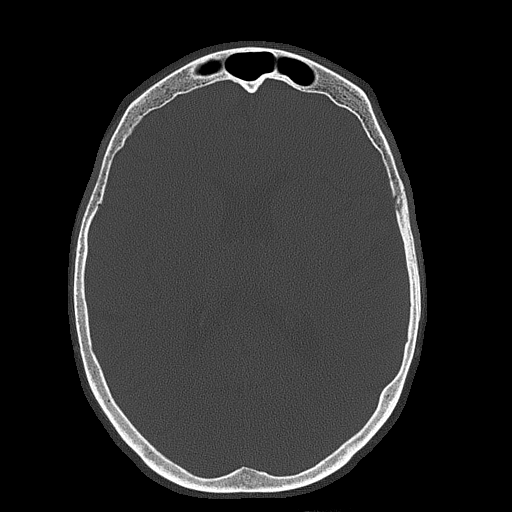
[im 49/82  brain]
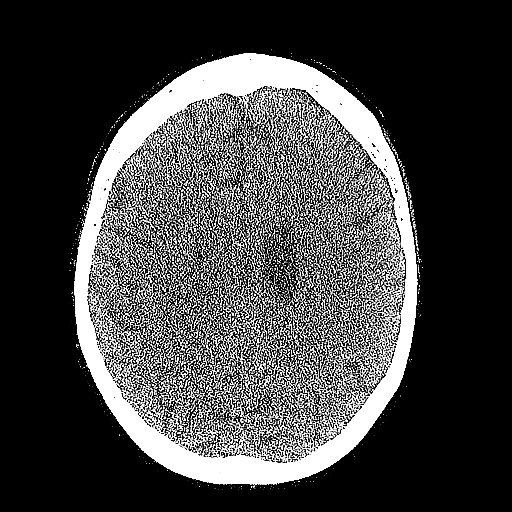
[im 57/82  brain]
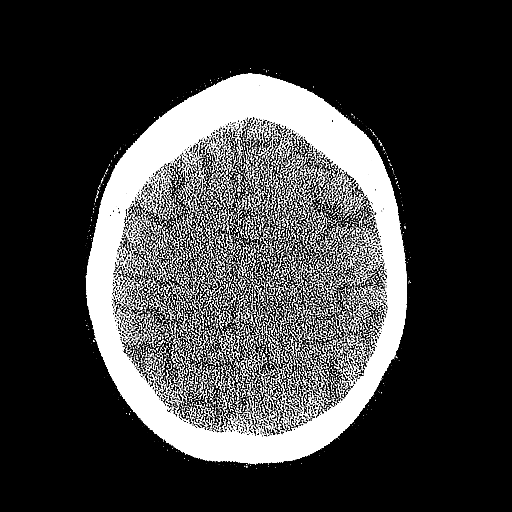
[im 65/82  brain]
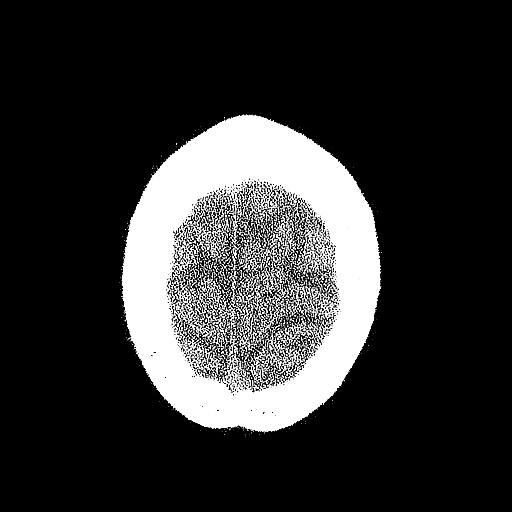
[im 73/82  brain]
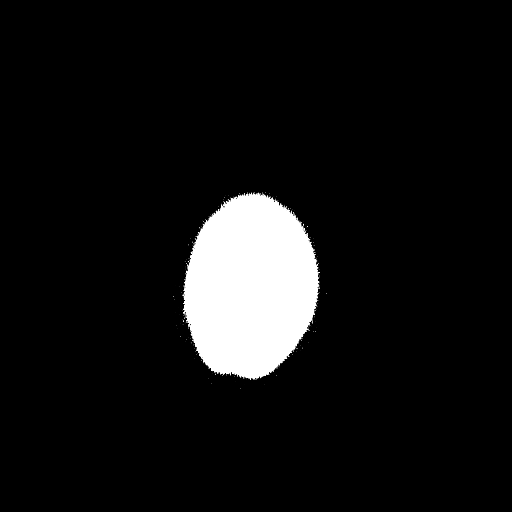
[im 73/82  bone]
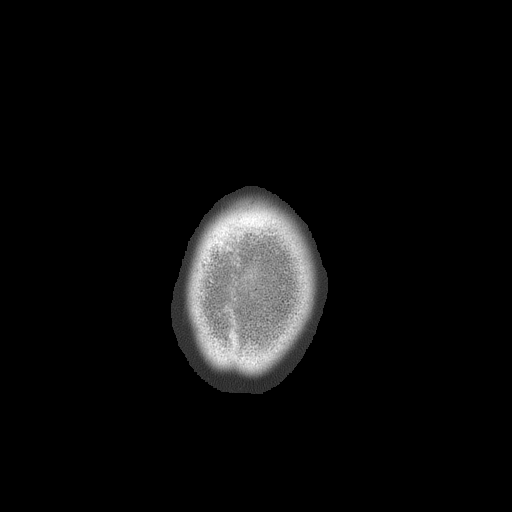

[Series 5: head 3.0 mpr cor · coronal · 0.31mm/px · 3 of 67 slices shown]
[im 23/67  brain]
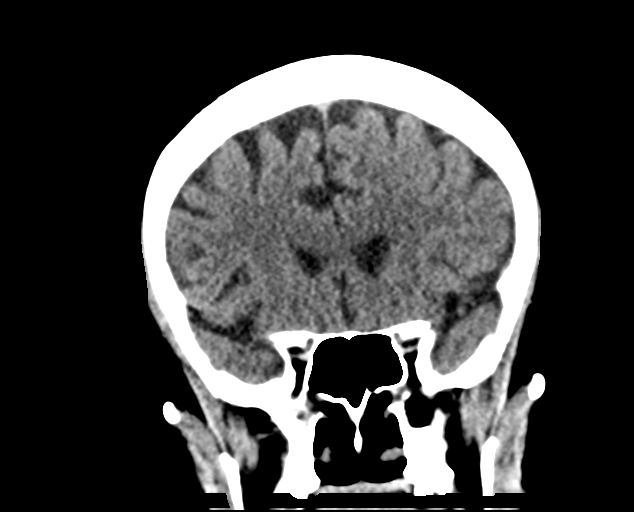
[im 30/67  brain]
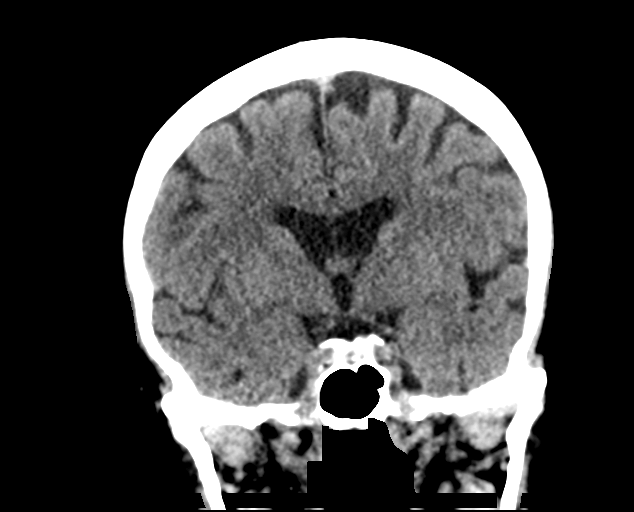
[im 37/67  brain]
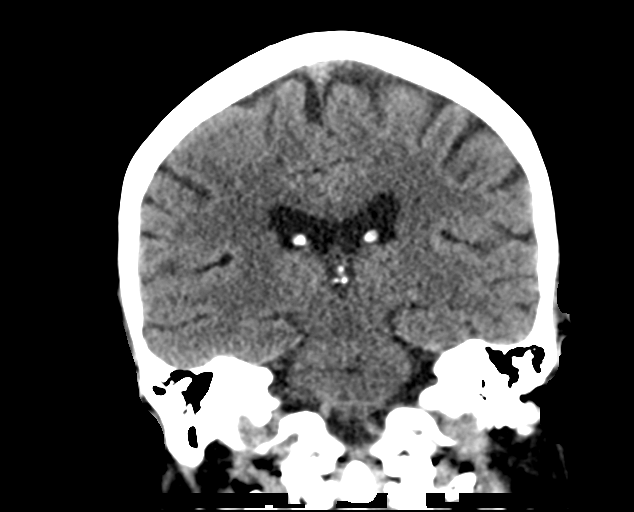

[Series 6: head 3.0 mpr sag · sagittal · 0.34mm/px · 3 of 65 slices shown]
[im 22/65  brain]
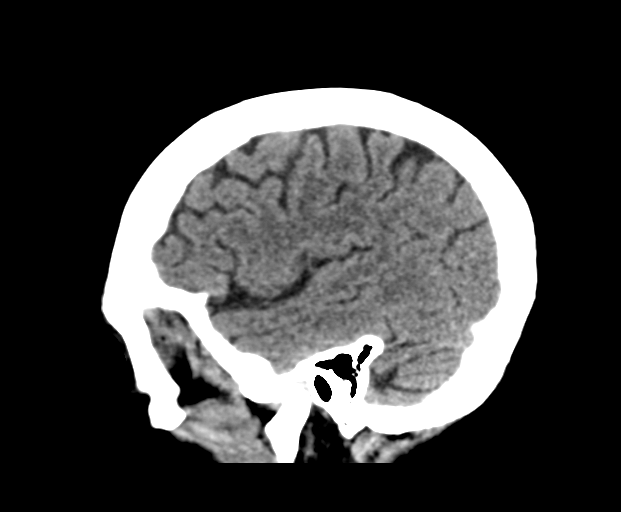
[im 33/65  brain]
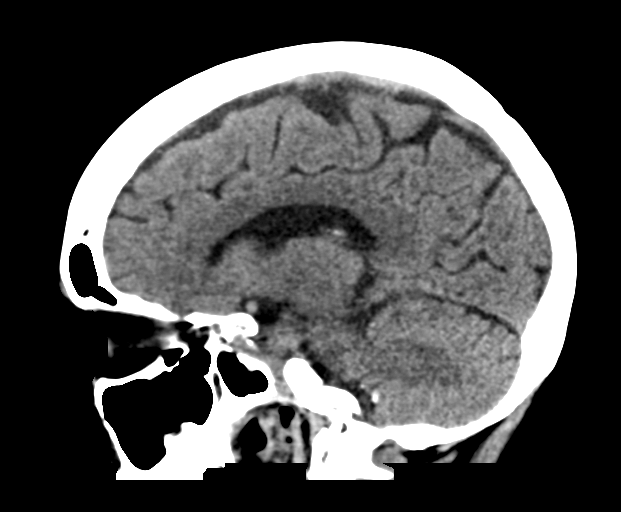
[im 43/65  brain]
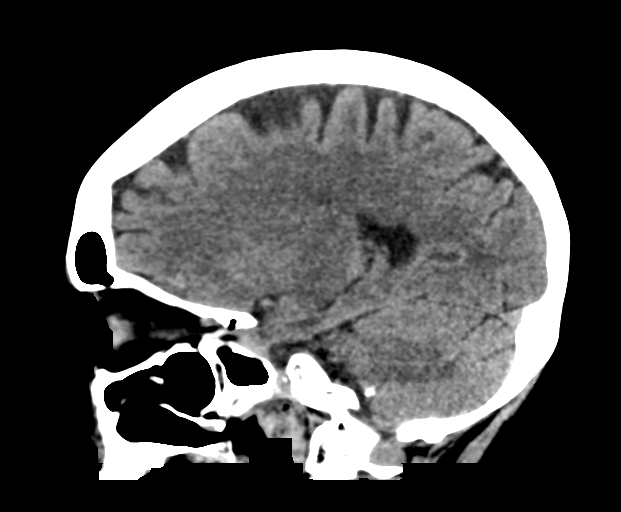

[15 of 47 positions shown; findings below may reference images not displayed]

FINDINGS: Brain: No evidence of acute infarction, hemorrhage, hydrocephalus,
extra-axial collection or mass lesion/mass effect.

Mild subcortical white matter and periventricular small vessel
ischemic changes.

Vascular: No hyperdense vessel or unexpected calcification.

Skull: Normal. Negative for fracture or focal lesion.

Sinuses/Orbits: The visualized paranasal sinuses are essentially
clear. The mastoid air cells are unopacified.

Other: Mild soft tissue swelling/hematoma along the lateral aspect
of the right orbit/zygoma (series 3/ image 12).
IMPRESSION: Mild soft swelling/hematoma along the lateral aspect of the right
orbit/ zygoma.

No evidence of acute intracranial abnormality. Mild small vessel
ischemic changes.

## 2019-12-14 IMAGING — DX DG CHEST 1V PORT
1 series · 1 of 1 positions shown · non-contrast
Comparison: 05/16/2017, 04/19/2014

CLINICAL DATA: Pt states she has had a dry cough at night for the
past couple of days after a cold. Unable to remove bra due to pt
being immobilized in sling for right shoulder. Pt states "she has
had all the pain she can take."

EXAM:
PORTABLE CHEST 1 VIEW

[chest ap]
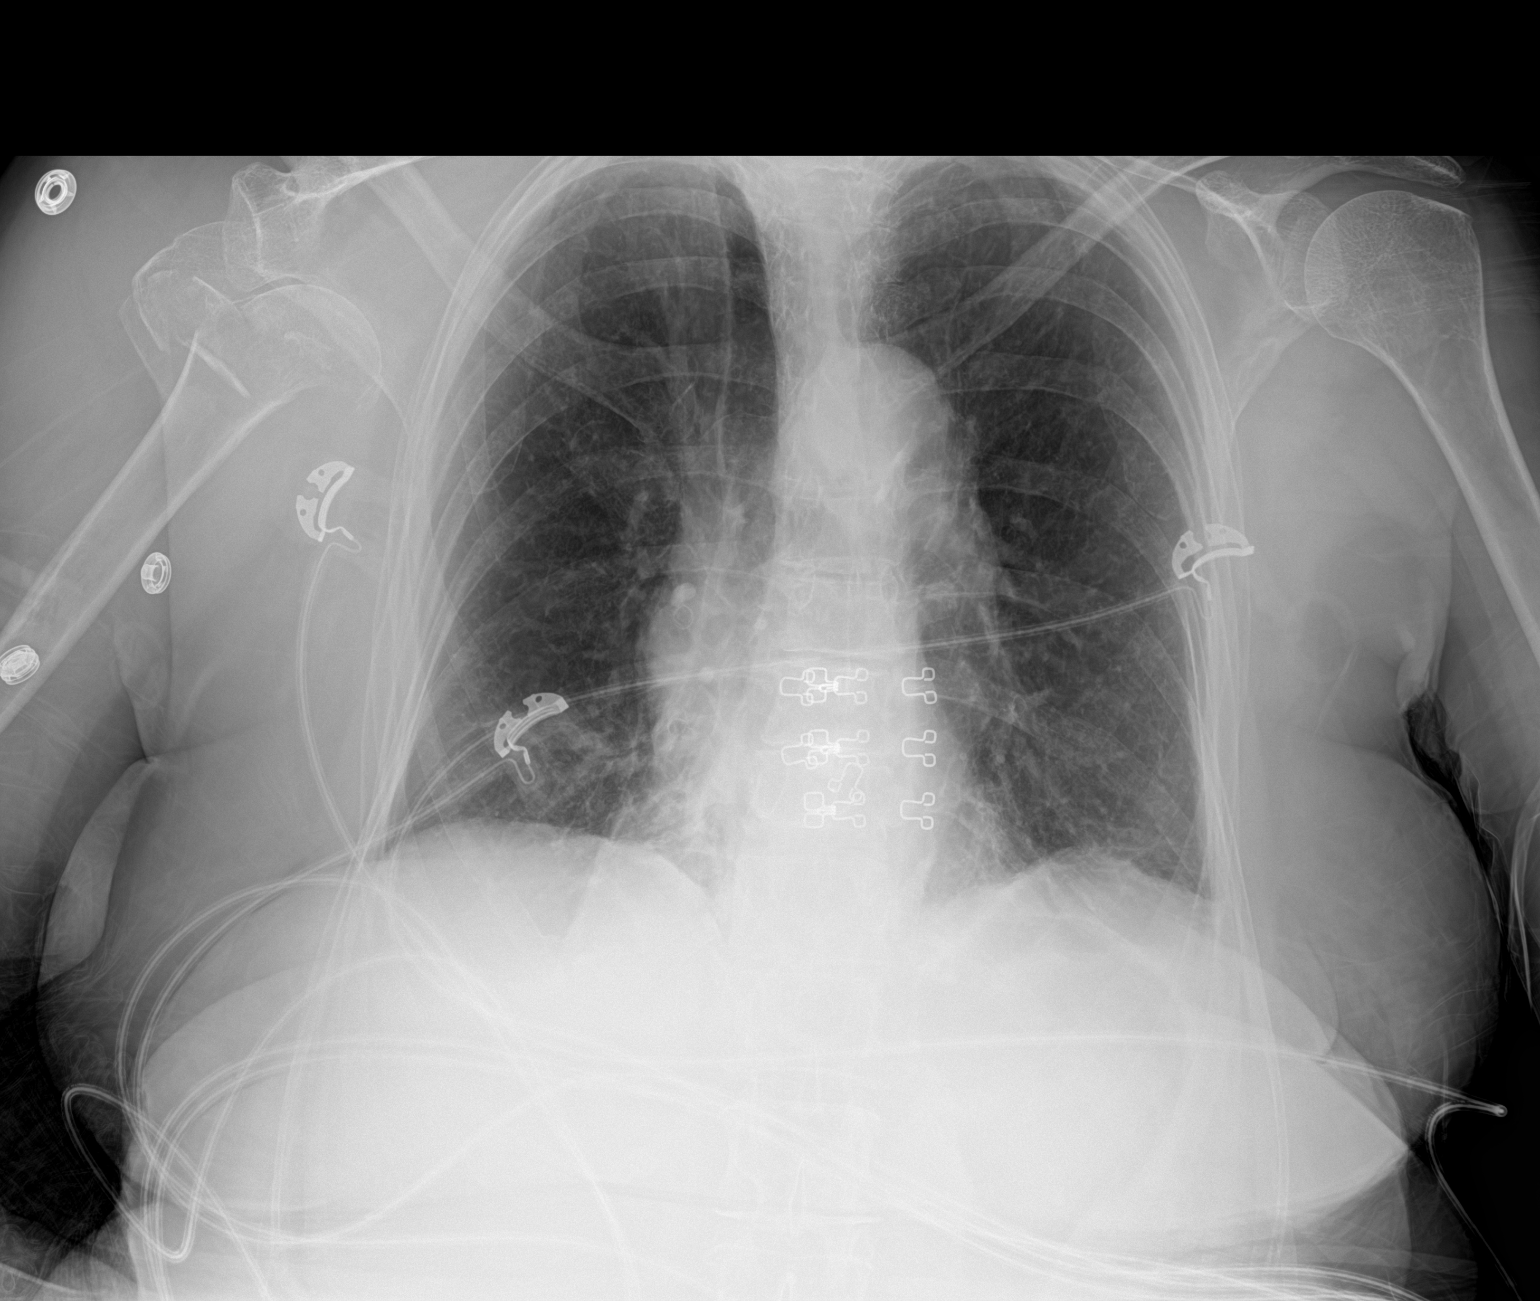

[1 of 1 positions shown; findings below may reference images not displayed]

FINDINGS: Shallow lung inflation. Heart size is accentuated by low lung
volumes. There are no focal consolidations or pleural effusions. No
pulmonary edema. There is minimal bibasilar atelectasis. Comminuted,
displaced right shoulder fracture.
IMPRESSION: No evidence for acute cardiopulmonary abnormality.

## 2019-12-17 IMAGING — RF DG SHOULDER 1V*R*
1 series · 2 of 2 positions shown · non-contrast
Comparison: CT RIGHT shoulder 05/16/2017

FLUOROSCOPY TIME:  0 minutes 7 seconds

Images obtained: 2

CLINICAL DATA: RIGHT shoulder replacement

EXAM:
DG C-ARM 61-120 MIN; RIGHT SHOULDER - 2 VIEW

[Series 1: run · 2 of 2 slices shown]
[im 1/2]
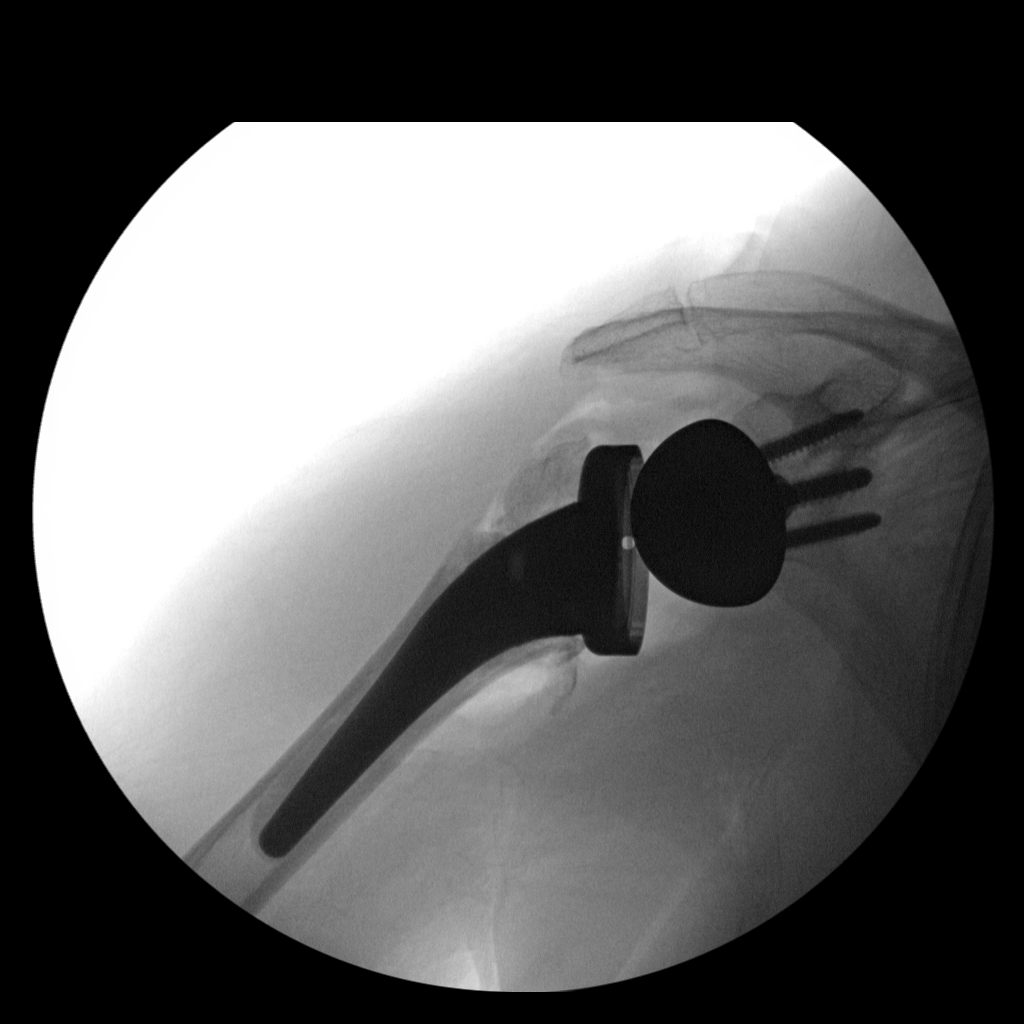
[im 2/2]
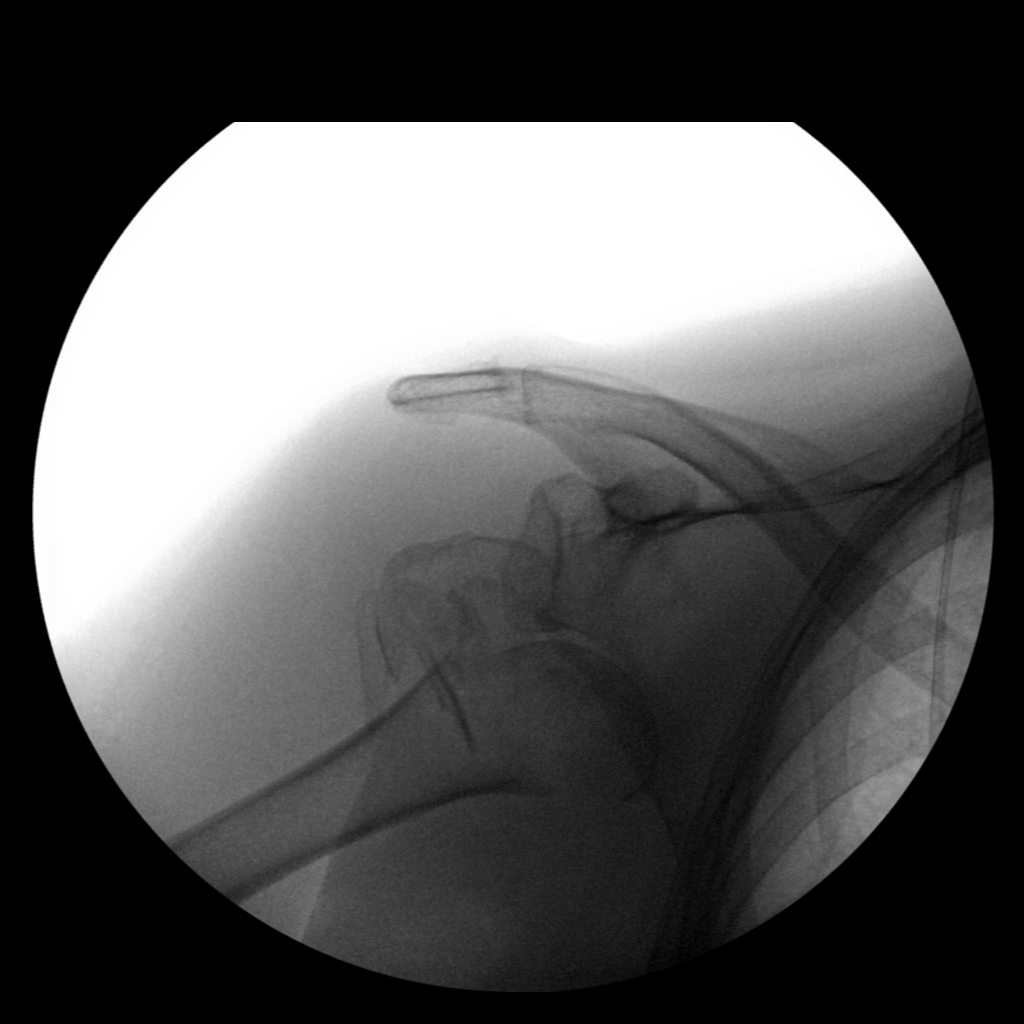

[2 of 2 positions shown; findings below may reference images not displayed]

FINDINGS: First image demonstrates a comminuted displaced fracture of the
proximal RIGHT humerus involving the surgical neck, head and greater
tuberosity.

Dislocation of the RIGHT humeral head.

AC joint alignment normal.

Bones diffusely demineralized.

Second image demonstrates placement of a reverse RIGHT shoulder
prosthesis without fracture or dislocation on single AP view.
IMPRESSION: Reverse RIGHT shoulder arthroplasty for comminuted fracture
dislocation of the proximal RIGHT humerus.

## 2019-12-26 DIAGNOSIS — E559 Vitamin D deficiency, unspecified: Secondary | ICD-10-CM | POA: Diagnosis not present

## 2019-12-26 DIAGNOSIS — E039 Hypothyroidism, unspecified: Secondary | ICD-10-CM | POA: Diagnosis not present

## 2019-12-26 DIAGNOSIS — R82998 Other abnormal findings in urine: Secondary | ICD-10-CM | POA: Diagnosis not present

## 2019-12-26 DIAGNOSIS — E7800 Pure hypercholesterolemia, unspecified: Secondary | ICD-10-CM | POA: Diagnosis not present

## 2020-01-02 DIAGNOSIS — E559 Vitamin D deficiency, unspecified: Secondary | ICD-10-CM | POA: Diagnosis not present

## 2020-01-02 DIAGNOSIS — E7800 Pure hypercholesterolemia, unspecified: Secondary | ICD-10-CM | POA: Diagnosis not present

## 2020-01-02 DIAGNOSIS — D692 Other nonthrombocytopenic purpura: Secondary | ICD-10-CM | POA: Diagnosis not present

## 2020-01-02 DIAGNOSIS — M810 Age-related osteoporosis without current pathological fracture: Secondary | ICD-10-CM | POA: Diagnosis not present

## 2020-01-02 DIAGNOSIS — Z96611 Presence of right artificial shoulder joint: Secondary | ICD-10-CM | POA: Diagnosis not present

## 2020-01-02 DIAGNOSIS — M40204 Unspecified kyphosis, thoracic region: Secondary | ICD-10-CM | POA: Diagnosis not present

## 2020-01-02 DIAGNOSIS — Z0000 Encounter for general adult medical examination without abnormal findings: Secondary | ICD-10-CM | POA: Diagnosis not present

## 2020-01-02 DIAGNOSIS — E039 Hypothyroidism, unspecified: Secondary | ICD-10-CM | POA: Diagnosis not present

## 2020-01-02 DIAGNOSIS — I700 Atherosclerosis of aorta: Secondary | ICD-10-CM | POA: Diagnosis not present

## 2020-01-17 DIAGNOSIS — H26492 Other secondary cataract, left eye: Secondary | ICD-10-CM | POA: Diagnosis not present

## 2020-06-27 DIAGNOSIS — Z20822 Contact with and (suspected) exposure to covid-19: Secondary | ICD-10-CM | POA: Diagnosis not present

## 2020-07-11 DIAGNOSIS — R030 Elevated blood-pressure reading, without diagnosis of hypertension: Secondary | ICD-10-CM | POA: Diagnosis not present

## 2020-07-11 DIAGNOSIS — I471 Supraventricular tachycardia: Secondary | ICD-10-CM | POA: Diagnosis not present

## 2020-07-11 DIAGNOSIS — E559 Vitamin D deficiency, unspecified: Secondary | ICD-10-CM | POA: Diagnosis not present

## 2020-07-11 DIAGNOSIS — G4700 Insomnia, unspecified: Secondary | ICD-10-CM | POA: Diagnosis not present

## 2020-07-11 DIAGNOSIS — M810 Age-related osteoporosis without current pathological fracture: Secondary | ICD-10-CM | POA: Diagnosis not present

## 2020-07-11 DIAGNOSIS — D692 Other nonthrombocytopenic purpura: Secondary | ICD-10-CM | POA: Diagnosis not present

## 2020-07-11 DIAGNOSIS — E039 Hypothyroidism, unspecified: Secondary | ICD-10-CM | POA: Diagnosis not present

## 2020-07-11 DIAGNOSIS — E7800 Pure hypercholesterolemia, unspecified: Secondary | ICD-10-CM | POA: Diagnosis not present

## 2020-07-11 DIAGNOSIS — I700 Atherosclerosis of aorta: Secondary | ICD-10-CM | POA: Diagnosis not present

## 2021-01-06 DIAGNOSIS — E039 Hypothyroidism, unspecified: Secondary | ICD-10-CM | POA: Diagnosis not present

## 2021-01-06 DIAGNOSIS — G8929 Other chronic pain: Secondary | ICD-10-CM | POA: Diagnosis not present

## 2021-01-06 DIAGNOSIS — I471 Supraventricular tachycardia: Secondary | ICD-10-CM | POA: Diagnosis not present

## 2021-01-06 DIAGNOSIS — I10 Essential (primary) hypertension: Secondary | ICD-10-CM | POA: Diagnosis not present

## 2021-01-06 DIAGNOSIS — K219 Gastro-esophageal reflux disease without esophagitis: Secondary | ICD-10-CM | POA: Diagnosis not present

## 2021-01-06 DIAGNOSIS — F325 Major depressive disorder, single episode, in full remission: Secondary | ICD-10-CM | POA: Diagnosis not present

## 2021-01-06 DIAGNOSIS — M810 Age-related osteoporosis without current pathological fracture: Secondary | ICD-10-CM | POA: Diagnosis not present

## 2021-01-06 DIAGNOSIS — R32 Unspecified urinary incontinence: Secondary | ICD-10-CM | POA: Diagnosis not present

## 2021-01-06 DIAGNOSIS — M1990 Unspecified osteoarthritis, unspecified site: Secondary | ICD-10-CM | POA: Diagnosis not present

## 2021-03-05 DIAGNOSIS — E7800 Pure hypercholesterolemia, unspecified: Secondary | ICD-10-CM | POA: Diagnosis not present

## 2021-03-05 DIAGNOSIS — E559 Vitamin D deficiency, unspecified: Secondary | ICD-10-CM | POA: Diagnosis not present

## 2021-03-05 DIAGNOSIS — E039 Hypothyroidism, unspecified: Secondary | ICD-10-CM | POA: Diagnosis not present

## 2021-03-18 DIAGNOSIS — M40204 Unspecified kyphosis, thoracic region: Secondary | ICD-10-CM | POA: Diagnosis not present

## 2021-03-18 DIAGNOSIS — M810 Age-related osteoporosis without current pathological fracture: Secondary | ICD-10-CM | POA: Diagnosis not present

## 2021-03-18 DIAGNOSIS — Z0000 Encounter for general adult medical examination without abnormal findings: Secondary | ICD-10-CM | POA: Diagnosis not present

## 2021-03-18 DIAGNOSIS — D692 Other nonthrombocytopenic purpura: Secondary | ICD-10-CM | POA: Diagnosis not present

## 2021-03-18 DIAGNOSIS — F3340 Major depressive disorder, recurrent, in remission, unspecified: Secondary | ICD-10-CM | POA: Diagnosis not present

## 2021-03-18 DIAGNOSIS — I700 Atherosclerosis of aorta: Secondary | ICD-10-CM | POA: Diagnosis not present

## 2021-03-18 DIAGNOSIS — E039 Hypothyroidism, unspecified: Secondary | ICD-10-CM | POA: Diagnosis not present

## 2021-03-18 DIAGNOSIS — G4700 Insomnia, unspecified: Secondary | ICD-10-CM | POA: Diagnosis not present

## 2021-03-18 DIAGNOSIS — R030 Elevated blood-pressure reading, without diagnosis of hypertension: Secondary | ICD-10-CM | POA: Diagnosis not present

## 2021-03-18 DIAGNOSIS — E7800 Pure hypercholesterolemia, unspecified: Secondary | ICD-10-CM | POA: Diagnosis not present

## 2021-03-18 DIAGNOSIS — R82998 Other abnormal findings in urine: Secondary | ICD-10-CM | POA: Diagnosis not present

## 2021-04-07 ENCOUNTER — Other Ambulatory Visit (HOSPITAL_COMMUNITY): Payer: Self-pay | Admitting: *Deleted

## 2021-04-08 ENCOUNTER — Other Ambulatory Visit: Payer: Self-pay

## 2021-04-08 ENCOUNTER — Ambulatory Visit (HOSPITAL_COMMUNITY)
Admission: RE | Admit: 2021-04-08 | Discharge: 2021-04-08 | Disposition: A | Discharge status: Home / Self Care | Payer: Medicare PPO | Source: Ambulatory Visit | Attending: Internal Medicine | Admitting: Internal Medicine

## 2021-04-08 DIAGNOSIS — M810 Age-related osteoporosis without current pathological fracture: Secondary | ICD-10-CM | POA: Insufficient documentation

## 2021-04-08 MED ORDER — DENOSUMAB 60 MG/ML ~~LOC~~ SOSY
60.0000 mg | PREFILLED_SYRINGE | Freq: Once | SUBCUTANEOUS | Status: AC
  Administered 2021-04-08: 60 mg via SUBCUTANEOUS

## 2021-09-15 DIAGNOSIS — E039 Hypothyroidism, unspecified: Secondary | ICD-10-CM | POA: Diagnosis not present

## 2021-09-15 DIAGNOSIS — D692 Other nonthrombocytopenic purpura: Secondary | ICD-10-CM | POA: Diagnosis not present

## 2021-09-15 DIAGNOSIS — I700 Atherosclerosis of aorta: Secondary | ICD-10-CM | POA: Diagnosis not present

## 2021-09-15 DIAGNOSIS — E559 Vitamin D deficiency, unspecified: Secondary | ICD-10-CM | POA: Diagnosis not present

## 2021-09-15 DIAGNOSIS — M1990 Unspecified osteoarthritis, unspecified site: Secondary | ICD-10-CM | POA: Diagnosis not present

## 2021-09-15 DIAGNOSIS — Z1389 Encounter for screening for other disorder: Secondary | ICD-10-CM | POA: Diagnosis not present

## 2021-09-15 DIAGNOSIS — M810 Age-related osteoporosis without current pathological fracture: Secondary | ICD-10-CM | POA: Diagnosis not present

## 2021-09-15 DIAGNOSIS — E7800 Pure hypercholesterolemia, unspecified: Secondary | ICD-10-CM | POA: Diagnosis not present

## 2021-09-15 DIAGNOSIS — N3281 Overactive bladder: Secondary | ICD-10-CM | POA: Diagnosis not present

## 2021-09-15 DIAGNOSIS — Z1331 Encounter for screening for depression: Secondary | ICD-10-CM | POA: Diagnosis not present

## 2021-09-15 DIAGNOSIS — R030 Elevated blood-pressure reading, without diagnosis of hypertension: Secondary | ICD-10-CM | POA: Diagnosis not present

## 2021-09-16 ENCOUNTER — Telehealth: Payer: Self-pay | Admitting: Pharmacy Technician

## 2021-09-16 ENCOUNTER — Other Ambulatory Visit: Payer: Self-pay

## 2021-09-16 NOTE — Telephone Encounter (Addendum)
Prolia- BIV Complete  Auth Submission: approved - site of care has been updated to [facility] Payer: [employer] Medication & CPT/J Code(s) submitted: Prolia (Denosumab) [id number] Route of submission (phone, fax, portal): phone: [phone] Auth type: Buy/Bill Units/visits requested: x1 dose Reference number: [id number] Approval from: 10/27/21 to 05/11/23  No new auth needed

## 2021-10-24 ENCOUNTER — Other Ambulatory Visit (HOSPITAL_COMMUNITY): Payer: Self-pay | Admitting: *Deleted

## 2021-10-27 ENCOUNTER — Ambulatory Visit (HOSPITAL_COMMUNITY)
Admission: RE | Admit: 2021-10-27 | Discharge: 2021-10-27 | Disposition: A | Discharge status: Home / Self Care | Payer: Medicare PPO | Source: Ambulatory Visit | Attending: Internal Medicine | Admitting: Internal Medicine

## 2021-10-27 DIAGNOSIS — M810 Age-related osteoporosis without current pathological fracture: Secondary | ICD-10-CM | POA: Insufficient documentation

## 2021-10-27 MED ORDER — DENOSUMAB 60 MG/ML ~~LOC~~ SOSY
PREFILLED_SYRINGE | SUBCUTANEOUS | Status: AC
  Filled 2021-10-27: qty 1

## 2021-10-27 MED ORDER — DENOSUMAB 60 MG/ML ~~LOC~~ SOSY
60.0000 mg | PREFILLED_SYRINGE | Freq: Once | SUBCUTANEOUS | Status: AC
  Administered 2021-10-27: 60 mg via SUBCUTANEOUS

## 2021-11-06 ENCOUNTER — Encounter: Payer: Self-pay | Admitting: Pulmonary Disease

## 2022-01-08 DIAGNOSIS — C44729 Squamous cell carcinoma of skin of left lower limb, including hip: Secondary | ICD-10-CM | POA: Diagnosis not present

## 2022-01-08 DIAGNOSIS — D485 Neoplasm of uncertain behavior of skin: Secondary | ICD-10-CM | POA: Diagnosis not present

## 2022-01-21 DIAGNOSIS — S81802A Unspecified open wound, left lower leg, initial encounter: Secondary | ICD-10-CM | POA: Diagnosis not present

## 2022-02-05 DIAGNOSIS — D0472 Carcinoma in situ of skin of left lower limb, including hip: Secondary | ICD-10-CM | POA: Diagnosis not present

## 2022-02-24 DIAGNOSIS — R1310 Dysphagia, unspecified: Secondary | ICD-10-CM | POA: Diagnosis not present

## 2022-02-24 DIAGNOSIS — K219 Gastro-esophageal reflux disease without esophagitis: Secondary | ICD-10-CM | POA: Diagnosis not present

## 2022-03-19 DIAGNOSIS — D0472 Carcinoma in situ of skin of left lower limb, including hip: Secondary | ICD-10-CM | POA: Diagnosis not present

## 2022-03-19 DIAGNOSIS — L821 Other seborrheic keratosis: Secondary | ICD-10-CM | POA: Diagnosis not present

## 2022-03-19 DIAGNOSIS — D1801 Hemangioma of skin and subcutaneous tissue: Secondary | ICD-10-CM | POA: Diagnosis not present

## 2022-03-19 DIAGNOSIS — L814 Other melanin hyperpigmentation: Secondary | ICD-10-CM | POA: Diagnosis not present

## 2022-03-30 DIAGNOSIS — E7800 Pure hypercholesterolemia, unspecified: Secondary | ICD-10-CM | POA: Diagnosis not present

## 2022-03-30 DIAGNOSIS — E559 Vitamin D deficiency, unspecified: Secondary | ICD-10-CM | POA: Diagnosis not present

## 2022-03-30 DIAGNOSIS — E039 Hypothyroidism, unspecified: Secondary | ICD-10-CM | POA: Diagnosis not present

## 2022-03-30 DIAGNOSIS — I700 Atherosclerosis of aorta: Secondary | ICD-10-CM | POA: Diagnosis not present

## 2022-03-30 DIAGNOSIS — R7989 Other specified abnormal findings of blood chemistry: Secondary | ICD-10-CM | POA: Diagnosis not present

## 2022-04-06 DIAGNOSIS — R1310 Dysphagia, unspecified: Secondary | ICD-10-CM | POA: Diagnosis not present

## 2022-04-06 DIAGNOSIS — E7800 Pure hypercholesterolemia, unspecified: Secondary | ICD-10-CM | POA: Diagnosis not present

## 2022-04-06 DIAGNOSIS — N3281 Overactive bladder: Secondary | ICD-10-CM | POA: Diagnosis not present

## 2022-04-06 DIAGNOSIS — Z0000 Encounter for general adult medical examination without abnormal findings: Secondary | ICD-10-CM | POA: Diagnosis not present

## 2022-04-06 DIAGNOSIS — R030 Elevated blood-pressure reading, without diagnosis of hypertension: Secondary | ICD-10-CM | POA: Diagnosis not present

## 2022-04-06 DIAGNOSIS — Z96611 Presence of right artificial shoulder joint: Secondary | ICD-10-CM | POA: Diagnosis not present

## 2022-04-06 DIAGNOSIS — M810 Age-related osteoporosis without current pathological fracture: Secondary | ICD-10-CM | POA: Diagnosis not present

## 2022-04-06 DIAGNOSIS — I700 Atherosclerosis of aorta: Secondary | ICD-10-CM | POA: Diagnosis not present

## 2022-04-06 DIAGNOSIS — E039 Hypothyroidism, unspecified: Secondary | ICD-10-CM | POA: Diagnosis not present

## 2022-04-06 DIAGNOSIS — R82998 Other abnormal findings in urine: Secondary | ICD-10-CM | POA: Diagnosis not present

## 2022-04-29 ENCOUNTER — Ambulatory Visit (INDEPENDENT_AMBULATORY_CARE_PROVIDER_SITE_OTHER): Payer: Medicare PPO

## 2022-04-29 VITALS — BP 120/74 | HR 66 | Temp 98.1°F | Resp 16

## 2022-04-29 DIAGNOSIS — M810 Age-related osteoporosis without current pathological fracture: Secondary | ICD-10-CM | POA: Diagnosis not present

## 2022-04-29 MED ORDER — DENOSUMAB 60 MG/ML ~~LOC~~ SOSY
60.0000 mg | PREFILLED_SYRINGE | Freq: Once | SUBCUTANEOUS | Status: AC
  Administered 2022-04-29: 60 mg via SUBCUTANEOUS
  Filled 2022-04-29: qty 1

## 2022-04-29 NOTE — Progress Notes (Signed)
Diagnosis: Osteoporosis  Provider:  Marshell Garfinkel MD  Procedure: Injection  Prolia (Denosumab), Dose: 60 mg, Site: subcutaneous, Number of injections: 1  Post Care: Patient declined observation  Discharge: Condition: Good, Destination: Home . AVS provided to patient.   Performed by:  Adelina Mings, LPN

## 2022-05-14 DIAGNOSIS — R350 Frequency of micturition: Secondary | ICD-10-CM | POA: Diagnosis not present

## 2022-05-14 DIAGNOSIS — N3946 Mixed incontinence: Secondary | ICD-10-CM | POA: Diagnosis not present

## 2022-05-14 DIAGNOSIS — R351 Nocturia: Secondary | ICD-10-CM | POA: Diagnosis not present

## 2022-06-03 DIAGNOSIS — R197 Diarrhea, unspecified: Secondary | ICD-10-CM | POA: Diagnosis not present

## 2022-06-03 DIAGNOSIS — Z86010 Personal history of colonic polyps: Secondary | ICD-10-CM | POA: Diagnosis not present

## 2022-06-03 DIAGNOSIS — K219 Gastro-esophageal reflux disease without esophagitis: Secondary | ICD-10-CM | POA: Diagnosis not present

## 2022-06-03 DIAGNOSIS — R1310 Dysphagia, unspecified: Secondary | ICD-10-CM | POA: Diagnosis not present

## 2022-06-03 DIAGNOSIS — K5730 Diverticulosis of large intestine without perforation or abscess without bleeding: Secondary | ICD-10-CM | POA: Diagnosis not present

## 2022-06-04 DIAGNOSIS — N3946 Mixed incontinence: Secondary | ICD-10-CM | POA: Diagnosis not present

## 2022-06-18 ENCOUNTER — Other Ambulatory Visit: Payer: Self-pay | Admitting: Pharmacy Technician

## 2022-06-26 DIAGNOSIS — K219 Gastro-esophageal reflux disease without esophagitis: Secondary | ICD-10-CM | POA: Diagnosis not present

## 2022-06-26 DIAGNOSIS — K3189 Other diseases of stomach and duodenum: Secondary | ICD-10-CM | POA: Diagnosis not present

## 2022-07-30 DIAGNOSIS — Z1152 Encounter for screening for COVID-19: Secondary | ICD-10-CM | POA: Diagnosis not present

## 2022-07-30 DIAGNOSIS — R0981 Nasal congestion: Secondary | ICD-10-CM | POA: Diagnosis not present

## 2022-07-30 DIAGNOSIS — J069 Acute upper respiratory infection, unspecified: Secondary | ICD-10-CM | POA: Diagnosis not present

## 2022-07-30 DIAGNOSIS — R110 Nausea: Secondary | ICD-10-CM | POA: Diagnosis not present

## 2022-07-30 DIAGNOSIS — R5383 Other fatigue: Secondary | ICD-10-CM | POA: Diagnosis not present

## 2022-07-30 DIAGNOSIS — J040 Acute laryngitis: Secondary | ICD-10-CM | POA: Diagnosis not present

## 2022-07-30 DIAGNOSIS — R638 Other symptoms and signs concerning food and fluid intake: Secondary | ICD-10-CM | POA: Diagnosis not present

## 2022-08-20 DIAGNOSIS — R350 Frequency of micturition: Secondary | ICD-10-CM | POA: Diagnosis not present

## 2022-08-20 DIAGNOSIS — N3946 Mixed incontinence: Secondary | ICD-10-CM | POA: Diagnosis not present

## 2022-09-16 DIAGNOSIS — R490 Dysphonia: Secondary | ICD-10-CM | POA: Diagnosis not present

## 2022-09-16 DIAGNOSIS — R1310 Dysphagia, unspecified: Secondary | ICD-10-CM | POA: Diagnosis not present

## 2022-09-16 DIAGNOSIS — K5790 Diverticulosis of intestine, part unspecified, without perforation or abscess without bleeding: Secondary | ICD-10-CM | POA: Diagnosis not present

## 2022-09-16 DIAGNOSIS — K580 Irritable bowel syndrome with diarrhea: Secondary | ICD-10-CM | POA: Diagnosis not present

## 2022-09-16 DIAGNOSIS — Z86010 Personal history of colonic polyps: Secondary | ICD-10-CM | POA: Diagnosis not present

## 2022-09-16 DIAGNOSIS — K224 Dyskinesia of esophagus: Secondary | ICD-10-CM | POA: Diagnosis not present

## 2022-09-16 DIAGNOSIS — R1013 Epigastric pain: Secondary | ICD-10-CM | POA: Diagnosis not present

## 2022-09-30 DIAGNOSIS — L814 Other melanin hyperpigmentation: Secondary | ICD-10-CM | POA: Diagnosis not present

## 2022-09-30 DIAGNOSIS — L820 Inflamed seborrheic keratosis: Secondary | ICD-10-CM | POA: Diagnosis not present

## 2022-09-30 DIAGNOSIS — C44719 Basal cell carcinoma of skin of left lower limb, including hip: Secondary | ICD-10-CM | POA: Diagnosis not present

## 2022-09-30 DIAGNOSIS — L298 Other pruritus: Secondary | ICD-10-CM | POA: Diagnosis not present

## 2022-09-30 DIAGNOSIS — D225 Melanocytic nevi of trunk: Secondary | ICD-10-CM | POA: Diagnosis not present

## 2022-09-30 DIAGNOSIS — L821 Other seborrheic keratosis: Secondary | ICD-10-CM | POA: Diagnosis not present

## 2022-09-30 DIAGNOSIS — L538 Other specified erythematous conditions: Secondary | ICD-10-CM | POA: Diagnosis not present

## 2022-09-30 DIAGNOSIS — Z08 Encounter for follow-up examination after completed treatment for malignant neoplasm: Secondary | ICD-10-CM | POA: Diagnosis not present

## 2022-09-30 DIAGNOSIS — Z789 Other specified health status: Secondary | ICD-10-CM | POA: Diagnosis not present

## 2022-09-30 DIAGNOSIS — Z85828 Personal history of other malignant neoplasm of skin: Secondary | ICD-10-CM | POA: Diagnosis not present

## 2022-10-09 DIAGNOSIS — R350 Frequency of micturition: Secondary | ICD-10-CM | POA: Diagnosis not present

## 2022-10-09 DIAGNOSIS — N3946 Mixed incontinence: Secondary | ICD-10-CM | POA: Diagnosis not present

## 2022-10-12 DIAGNOSIS — R1310 Dysphagia, unspecified: Secondary | ICD-10-CM | POA: Diagnosis not present

## 2022-10-12 DIAGNOSIS — I471 Supraventricular tachycardia, unspecified: Secondary | ICD-10-CM | POA: Diagnosis not present

## 2022-10-12 DIAGNOSIS — I700 Atherosclerosis of aorta: Secondary | ICD-10-CM | POA: Diagnosis not present

## 2022-10-12 DIAGNOSIS — D692 Other nonthrombocytopenic purpura: Secondary | ICD-10-CM | POA: Diagnosis not present

## 2022-10-12 DIAGNOSIS — E039 Hypothyroidism, unspecified: Secondary | ICD-10-CM | POA: Diagnosis not present

## 2022-10-12 DIAGNOSIS — F3340 Major depressive disorder, recurrent, in remission, unspecified: Secondary | ICD-10-CM | POA: Diagnosis not present

## 2022-10-12 DIAGNOSIS — M810 Age-related osteoporosis without current pathological fracture: Secondary | ICD-10-CM | POA: Diagnosis not present

## 2022-10-12 DIAGNOSIS — E7800 Pure hypercholesterolemia, unspecified: Secondary | ICD-10-CM | POA: Diagnosis not present

## 2022-11-04 ENCOUNTER — Ambulatory Visit (INDEPENDENT_AMBULATORY_CARE_PROVIDER_SITE_OTHER): Payer: Medicare PPO | Admitting: *Deleted

## 2022-11-04 VITALS — BP 119/74 | HR 81 | Temp 97.7°F | Resp 16 | Ht 61.0 in | Wt 129.4 lb

## 2022-11-04 DIAGNOSIS — M810 Age-related osteoporosis without current pathological fracture: Secondary | ICD-10-CM | POA: Diagnosis not present

## 2022-11-04 MED ORDER — DENOSUMAB 60 MG/ML ~~LOC~~ SOSY
60.0000 mg | PREFILLED_SYRINGE | Freq: Once | SUBCUTANEOUS | Status: AC
  Administered 2022-11-04: 60 mg via SUBCUTANEOUS

## 2022-11-04 NOTE — Progress Notes (Signed)
Diagnosis: Osteoporosis  Provider:  Mannam, Praveen MD  Procedure: Injection  Prolia (Denosumab), Dose: 60 mg, Site: subcutaneous, Number of injections: 1  Post Care: Observation period completed  Discharge: Condition: Good, Destination: Home . AVS Provided  Performed by:  Mehgan Santmyer A, RN       

## 2022-11-25 DIAGNOSIS — N3281 Overactive bladder: Secondary | ICD-10-CM | POA: Diagnosis not present

## 2022-11-25 DIAGNOSIS — I700 Atherosclerosis of aorta: Secondary | ICD-10-CM | POA: Diagnosis not present

## 2022-11-25 DIAGNOSIS — R0602 Shortness of breath: Secondary | ICD-10-CM | POA: Diagnosis not present

## 2022-11-25 DIAGNOSIS — I872 Venous insufficiency (chronic) (peripheral): Secondary | ICD-10-CM | POA: Diagnosis not present

## 2022-11-25 DIAGNOSIS — E7800 Pure hypercholesterolemia, unspecified: Secondary | ICD-10-CM | POA: Diagnosis not present

## 2022-11-25 DIAGNOSIS — R030 Elevated blood-pressure reading, without diagnosis of hypertension: Secondary | ICD-10-CM | POA: Diagnosis not present

## 2022-11-25 DIAGNOSIS — K219 Gastro-esophageal reflux disease without esophagitis: Secondary | ICD-10-CM | POA: Diagnosis not present

## 2022-12-03 ENCOUNTER — Ambulatory Visit: Payer: Medicare PPO | Admitting: Podiatry

## 2022-12-03 ENCOUNTER — Other Ambulatory Visit: Payer: Self-pay | Admitting: Podiatry

## 2022-12-03 ENCOUNTER — Ambulatory Visit (INDEPENDENT_AMBULATORY_CARE_PROVIDER_SITE_OTHER): Payer: Medicare PPO

## 2022-12-03 DIAGNOSIS — M19072 Primary osteoarthritis, left ankle and foot: Secondary | ICD-10-CM

## 2022-12-03 DIAGNOSIS — M2042 Other hammer toe(s) (acquired), left foot: Secondary | ICD-10-CM | POA: Diagnosis not present

## 2022-12-03 NOTE — Progress Notes (Signed)
  Subjective:  Patient ID: Lori Krueger, female    DOB: [birth year],  MRN: [id number]  Chief Complaint  Patient presents with   Hammer Toe    Pt states she is having left foot pain on the left foot at first she thought it was her hammer toe but now she does not think it is she wants to see what it is it has been like this since July 3rd and making it hard to walk and going up and down steps. She has been elevating it she states that if she stands on it to long it swells up    88 y.o. female presents with the above complaint. History confirmed with patient.   Objective:  Physical Exam: warm, good capillary refill, no trophic changes or ulcerative lesions, normal DP and PT pulses, normal sensory exam, and pain tenderness swelling over dorsal lateral left midfoot..  Hallux valgus and hammertoe deformities   Radiographs: Multiple views x-ray of the left foot: End-stage arthrosis of midtarsal joint, hallux valgus and digital deformities noted Assessment:   1. Hammer toe of left foot   2. Arthritis of left midfoot      Plan:  Patient was evaluated and treated and all questions answered.  We discussed my clinical exam findings and her radiographic findings.  She has end-stage arthrosis of the left tarsometatarsal joints.  Her hammertoe is not consistent with her symptoms.  We discussed appropriate shoe gear for midfoot arthritis.  We also discussed topical use of Voltaren 1% gel 3-4 times daily.  Finally we also discussed corticosteroid injection for pain relief.  Is an upcoming wedding and would like to have pain relief for this.  Following sterile prep with Betadine the left dorsal midfoot and tarsometatarsal joints was injected with 20 mg of Kenalog 4 mg of dexamethasone and 0.5 cc of 0.5% bupivacaine plain.  She tolerated well and was dressed with a bandage.  She will follow-up with me as needed.  Return if symptoms worsen or fail to improve.

## 2023-02-13 DIAGNOSIS — E785 Hyperlipidemia, unspecified: Secondary | ICD-10-CM | POA: Insufficient documentation

## 2023-02-13 DIAGNOSIS — R0602 Shortness of breath: Secondary | ICD-10-CM | POA: Insufficient documentation

## 2023-02-13 NOTE — Progress Notes (Unsigned)
Cardiology Office Note   Date:  02/15/2023   ID:  Lori Krueger, DOB 08/29/1934, MRN [id number]  PCP:  Gaspar Garbe, MD  Cardiologist:   None Referring:  Ronney Lion, NP  Chief Complaint  Patient presents with   Edema      History of Present Illness: Lori Krueger is a 88 y.o. female who is referred for evaluation of SOB and dyslipidemia.  Looking at her primary care notes I also see that she has had some lower extremity swelling.  She was traveling a lot recently and was in [place] earlier this year when she developed some lower extremity swelling and joint pain.  This slowly resolved.  She actually does her own house chores.  This includes some vacuuming.  She has some walking around her neighborhood.  She might get a little dyspneic with this but this seems to be intermittent and mild.  She does not have chest pressure, neck or arm discomfort.  She does not really notice any palpitations, presyncope or syncope.  She has had none of the SVT really that she had before.  She does not pass out.  Her lower extremity swelling is improved.   Past Medical History:  Diagnosis Date   Dyslipidemia    Hypothyroidism    Osteoporosis    Skin cancer    SVT (supraventricular tachycardia) (HCC)     Past Surgical History:  Procedure Laterality Date   BUNIONECTOMY     Hammer toe repair     REVERSE SHOULDER ARTHROPLASTY Right 05/19/2017   Procedure: REVERSETOTAL SHOULDER ARTHROPLASTY;  Surgeon: Bjorn Pippin, MD;  Location: [facility] OR;  Service: Orthopedics;  Laterality: Right;     Current Outpatient Medications  Medication Sig Dispense Refill   ASHWAGANDHA PO Take 600 mg by mouth daily.     Cholecalciferol (VITAMIN D-3) 1000 units CAPS Take 1,000 Units by mouth daily.     levothyroxine (SYNTHROID, LEVOTHROID) 100 MCG tablet Take 100 mcg by mouth See admin instructions. 100 mcg in the morning on Mon/Tues/Wed/Thurs/Fri/Sat, skip Sundays     metoprolol succinate (TOPROL-XL) 25 MG  24 hr tablet Take 25 mg by mouth daily.     Multiple Vitamins-Minerals (MULTIVITAMINS THER. W/MINERALS) TABS Take 1 tablet by mouth daily.     omeprazole (PRILOSEC) 40 MG capsule Take 40 mg by mouth 2 (two) times daily.     aspirin EC 81 MG tablet Take 81 mg by mouth daily. (Patient not taking: Reported on 02/15/2023)     NONFORMULARY OR COMPOUNDED ITEM [facility]:  Pain Creams 18. Ketamine 5%, Bacloffen 2%, Gabapentin 5%, Lidocaine 5%, Menthol 1%. Apply 1-2 grams to affected area 3-4 times a day. (Patient not taking: Reported on 02/15/2023) 100 each 5   VESICARE 5 MG tablet Take 1 tablet by mouth daily. (Patient not taking: Reported on 02/15/2023)     Vibegron (GEMTESA PO) Take by mouth.     No current facility-administered medications for this visit.    Allergies:   Codeine and Lactose intolerance (gi)    Social History:  The patient  reports that she has never smoked. She has never used smokeless tobacco. She reports that she does not drink alcohol and does not use drugs.   Family History:  The patient's family history includes Alcohol abuse (age of onset: 53) in her father; Stroke (age of onset: 76) in her mother.    ROS:  Please see the history of present illness.   Otherwise, review of systems  are positive for none.   All other systems are reviewed and negative.    PHYSICAL EXAM: VS:  BP 122/80 (BP Location: Left Arm, Patient Position: Sitting, Cuff Size: Normal)   Pulse 63   Ht 5' (1.524 m)   Wt 125 lb 12.8 oz (57.1 kg)   SpO2 94%   BMI 24.57 kg/m  , BMI Body mass index is 24.57 kg/m. GENERAL:  Well appearing HEENT:  Pupils equal round and reactive, fundi not visualized, oral mucosa unremarkable NECK:  No jugular venous distention, waveform within normal limits, carotid upstroke brisk and symmetric, no bruits, no thyromegaly LYMPHATICS:  No cervical, inguinal adenopathy LUNGS:  Clear to auscultation bilaterally BACK:  No CVA tenderness CHEST:  Unremarkable HEART:   PMI not displaced or sustained,S1 and S2 within normal limits, no S3, no S4, no clicks, no rubs, no murmurs ABD:  Flat, positive bowel sounds normal in frequency in pitch, no bruits, no rebound, no guarding, no midline pulsatile mass, no hepatomegaly, no splenomegaly EXT:  2 plus pulses throughout, no edema, no cyanosis no clubbing SKIN:  No rashes no nodules NEURO:  Cranial nerves II through XII grossly intact, motor grossly intact throughout [facility]:  Cognitively intact, oriented to person place and time    EKG:  EKG Interpretation Date/Time:  Monday February 15 2023 09:21:32 EDT Ventricular Rate:  64 PR Interval:  184 QRS Duration:  88 QT Interval:  426 QTC Calculation: 439 R Axis:   54  Text Interpretation: Normal sinus rhythm Normal ECG When compared with ECG of 16-May-2017 19:35, No significant change since last tracing Confirmed by Rollene Rotunda ([id number]) on 02/15/2023 9:24:48 AM     Recent Labs: No results found for requested labs within last 365 days.    Lipid Panel No results found for: "CHOL", "TRIG", "HDL", "CHOLHDL", "VLDL", "LDLCALC", "LDLDIRECT"    Wt Readings from Last 3 Encounters:  02/15/23 125 lb 12.8 oz (57.1 kg)  11/04/22 129 lb 6.4 oz (58.7 kg)  05/19/17 140 lb (63.5 kg)      Other studies Reviewed: Additional studies/ records that were reviewed today include: Labs and primary office number. Review of the above records demonstrates:  Please see elsewhere in the note.     ASSESSMENT AND PLAN:  Edema: The patient has had some mild lower extremity edema.  However, this seems to be resolved.  She had a normal echo 11 years ago.  She has no overt physical findings suggestive of heart failure.  EKG is unremarkable.  At this point I would not suggest further cardiovascular testing but would continue conservative management to include possible compression stockings, salt and fluid restriction.  Of note renal function, thyroid and electrolytes are  unremarkable.  Dyslipidemia: She does have some dyslipidemia.  We talked about a plant-based diet.  I agree that she does not need additional therapies.  Neuropathy: The patient has some mild tingling in her legs and might be developing some neuropathy.  She could take benfotiamine  Current medicines are reviewed at length with the patient today.  The patient does not have concerns regarding medicines.  The following changes have been made:  no change  Labs/ tests ordered today include: None  Orders Placed This Encounter  Procedures   EKG 12-Lead     Disposition:   FU with with me as needed.     Signed, Rollene Rotunda, MD  02/15/2023 9:48 AM    [facility]

## 2023-02-15 ENCOUNTER — Encounter: Payer: Self-pay | Admitting: Cardiology

## 2023-02-15 ENCOUNTER — Ambulatory Visit: Payer: Medicare PPO | Attending: Cardiology | Admitting: Cardiology

## 2023-02-15 VITALS — BP 122/80 | HR 63 | Ht 60.0 in | Wt 125.8 lb

## 2023-02-15 DIAGNOSIS — R0602 Shortness of breath: Secondary | ICD-10-CM | POA: Diagnosis not present

## 2023-02-15 DIAGNOSIS — M7989 Other specified soft tissue disorders: Secondary | ICD-10-CM

## 2023-02-15 DIAGNOSIS — E785 Hyperlipidemia, unspecified: Secondary | ICD-10-CM | POA: Diagnosis not present

## 2023-03-08 DIAGNOSIS — C44719 Basal cell carcinoma of skin of left lower limb, including hip: Secondary | ICD-10-CM | POA: Diagnosis not present

## 2023-03-18 DIAGNOSIS — Z85828 Personal history of other malignant neoplasm of skin: Secondary | ICD-10-CM | POA: Diagnosis not present

## 2023-03-18 DIAGNOSIS — F325 Major depressive disorder, single episode, in full remission: Secondary | ICD-10-CM | POA: Diagnosis not present

## 2023-03-18 DIAGNOSIS — E039 Hypothyroidism, unspecified: Secondary | ICD-10-CM | POA: Diagnosis not present

## 2023-03-18 DIAGNOSIS — E785 Hyperlipidemia, unspecified: Secondary | ICD-10-CM | POA: Diagnosis not present

## 2023-03-18 DIAGNOSIS — I10 Essential (primary) hypertension: Secondary | ICD-10-CM | POA: Diagnosis not present

## 2023-03-18 DIAGNOSIS — M40209 Unspecified kyphosis, site unspecified: Secondary | ICD-10-CM | POA: Diagnosis not present

## 2023-03-18 DIAGNOSIS — M810 Age-related osteoporosis without current pathological fracture: Secondary | ICD-10-CM | POA: Diagnosis not present

## 2023-03-18 DIAGNOSIS — M1990 Unspecified osteoarthritis, unspecified site: Secondary | ICD-10-CM | POA: Diagnosis not present

## 2023-03-18 DIAGNOSIS — D6949 Other primary thrombocytopenia: Secondary | ICD-10-CM | POA: Diagnosis not present

## 2023-03-18 DIAGNOSIS — R32 Unspecified urinary incontinence: Secondary | ICD-10-CM | POA: Diagnosis not present

## 2023-03-18 DIAGNOSIS — Z833 Family history of diabetes mellitus: Secondary | ICD-10-CM | POA: Diagnosis not present

## 2023-03-18 DIAGNOSIS — I700 Atherosclerosis of aorta: Secondary | ICD-10-CM | POA: Diagnosis not present

## 2023-03-18 DIAGNOSIS — I471 Supraventricular tachycardia, unspecified: Secondary | ICD-10-CM | POA: Diagnosis not present

## 2023-03-18 DIAGNOSIS — K219 Gastro-esophageal reflux disease without esophagitis: Secondary | ICD-10-CM | POA: Diagnosis not present

## 2023-03-18 DIAGNOSIS — M2040 Other hammer toe(s) (acquired), unspecified foot: Secondary | ICD-10-CM | POA: Diagnosis not present

## 2023-03-18 DIAGNOSIS — K589 Irritable bowel syndrome without diarrhea: Secondary | ICD-10-CM | POA: Diagnosis not present

## 2023-04-12 DIAGNOSIS — E559 Vitamin D deficiency, unspecified: Secondary | ICD-10-CM | POA: Diagnosis not present

## 2023-04-12 DIAGNOSIS — E7800 Pure hypercholesterolemia, unspecified: Secondary | ICD-10-CM | POA: Diagnosis not present

## 2023-04-12 DIAGNOSIS — E039 Hypothyroidism, unspecified: Secondary | ICD-10-CM | POA: Diagnosis not present

## 2023-04-14 DIAGNOSIS — R030 Elevated blood-pressure reading, without diagnosis of hypertension: Secondary | ICD-10-CM | POA: Diagnosis not present

## 2023-04-14 DIAGNOSIS — K219 Gastro-esophageal reflux disease without esophagitis: Secondary | ICD-10-CM | POA: Diagnosis not present

## 2023-04-14 DIAGNOSIS — M810 Age-related osteoporosis without current pathological fracture: Secondary | ICD-10-CM | POA: Diagnosis not present

## 2023-04-14 DIAGNOSIS — Z1339 Encounter for screening examination for other mental health and behavioral disorders: Secondary | ICD-10-CM | POA: Diagnosis not present

## 2023-04-14 DIAGNOSIS — R82998 Other abnormal findings in urine: Secondary | ICD-10-CM | POA: Diagnosis not present

## 2023-04-14 DIAGNOSIS — Z0000 Encounter for general adult medical examination without abnormal findings: Secondary | ICD-10-CM | POA: Diagnosis not present

## 2023-04-14 DIAGNOSIS — E7800 Pure hypercholesterolemia, unspecified: Secondary | ICD-10-CM | POA: Diagnosis not present

## 2023-04-14 DIAGNOSIS — D692 Other nonthrombocytopenic purpura: Secondary | ICD-10-CM | POA: Diagnosis not present

## 2023-04-14 DIAGNOSIS — E039 Hypothyroidism, unspecified: Secondary | ICD-10-CM | POA: Diagnosis not present

## 2023-04-14 DIAGNOSIS — F3340 Major depressive disorder, recurrent, in remission, unspecified: Secondary | ICD-10-CM | POA: Diagnosis not present

## 2023-04-14 DIAGNOSIS — I700 Atherosclerosis of aorta: Secondary | ICD-10-CM | POA: Diagnosis not present

## 2023-04-14 DIAGNOSIS — Z1331 Encounter for screening for depression: Secondary | ICD-10-CM | POA: Diagnosis not present

## 2023-05-27 ENCOUNTER — Other Ambulatory Visit (HOSPITAL_COMMUNITY): Payer: Self-pay | Admitting: *Deleted

## 2023-05-28 ENCOUNTER — Ambulatory Visit (HOSPITAL_COMMUNITY)
Admission: RE | Admit: 2023-05-28 | Discharge: 2023-05-28 | Disposition: A | Discharge status: Home / Self Care | Payer: Medicare PPO | Source: Ambulatory Visit | Attending: Internal Medicine | Admitting: Internal Medicine

## 2023-05-28 DIAGNOSIS — M810 Age-related osteoporosis without current pathological fracture: Secondary | ICD-10-CM | POA: Diagnosis not present

## 2023-05-28 MED ORDER — DENOSUMAB 60 MG/ML ~~LOC~~ SOSY
PREFILLED_SYRINGE | SUBCUTANEOUS | Status: AC
  Filled 2023-05-28: qty 1

## 2023-05-28 MED ORDER — DENOSUMAB 60 MG/ML ~~LOC~~ SOSY
60.0000 mg | PREFILLED_SYRINGE | Freq: Once | SUBCUTANEOUS | Status: AC
  Administered 2023-05-28: 60 mg via SUBCUTANEOUS

## 2023-07-23 DIAGNOSIS — M545 Low back pain, unspecified: Secondary | ICD-10-CM | POA: Diagnosis not present

## 2023-07-23 DIAGNOSIS — M25551 Pain in right hip: Secondary | ICD-10-CM | POA: Diagnosis not present

## 2023-08-02 DIAGNOSIS — G4700 Insomnia, unspecified: Secondary | ICD-10-CM | POA: Diagnosis not present

## 2023-08-02 DIAGNOSIS — R030 Elevated blood-pressure reading, without diagnosis of hypertension: Secondary | ICD-10-CM | POA: Diagnosis not present

## 2023-08-02 DIAGNOSIS — M545 Low back pain, unspecified: Secondary | ICD-10-CM | POA: Diagnosis not present

## 2023-08-02 DIAGNOSIS — F3340 Major depressive disorder, recurrent, in remission, unspecified: Secondary | ICD-10-CM | POA: Diagnosis not present

## 2023-08-09 DIAGNOSIS — M545 Low back pain, unspecified: Secondary | ICD-10-CM | POA: Diagnosis not present

## 2023-08-09 DIAGNOSIS — M25551 Pain in right hip: Secondary | ICD-10-CM | POA: Diagnosis not present

## 2023-08-09 DIAGNOSIS — M7602 Gluteal tendinitis, left hip: Secondary | ICD-10-CM | POA: Diagnosis not present

## 2023-08-23 DIAGNOSIS — M545 Low back pain, unspecified: Secondary | ICD-10-CM | POA: Diagnosis not present

## 2023-08-23 DIAGNOSIS — M7602 Gluteal tendinitis, left hip: Secondary | ICD-10-CM | POA: Diagnosis not present

## 2023-08-26 DIAGNOSIS — M7602 Gluteal tendinitis, left hip: Secondary | ICD-10-CM | POA: Diagnosis not present

## 2023-08-26 DIAGNOSIS — M545 Low back pain, unspecified: Secondary | ICD-10-CM | POA: Diagnosis not present

## 2023-09-02 DIAGNOSIS — M7602 Gluteal tendinitis, left hip: Secondary | ICD-10-CM | POA: Diagnosis not present

## 2023-09-02 DIAGNOSIS — M545 Low back pain, unspecified: Secondary | ICD-10-CM | POA: Diagnosis not present

## 2023-09-03 DIAGNOSIS — G4700 Insomnia, unspecified: Secondary | ICD-10-CM | POA: Diagnosis not present

## 2023-09-03 DIAGNOSIS — R030 Elevated blood-pressure reading, without diagnosis of hypertension: Secondary | ICD-10-CM | POA: Diagnosis not present

## 2023-09-03 DIAGNOSIS — F3340 Major depressive disorder, recurrent, in remission, unspecified: Secondary | ICD-10-CM | POA: Diagnosis not present

## 2023-09-08 ENCOUNTER — Ambulatory Visit: Admitting: Licensed Clinical Social Worker

## 2023-09-13 DIAGNOSIS — M7602 Gluteal tendinitis, left hip: Secondary | ICD-10-CM | POA: Diagnosis not present

## 2023-09-13 DIAGNOSIS — M545 Low back pain, unspecified: Secondary | ICD-10-CM | POA: Diagnosis not present

## 2023-09-17 DIAGNOSIS — M7602 Gluteal tendinitis, left hip: Secondary | ICD-10-CM | POA: Diagnosis not present

## 2023-09-17 DIAGNOSIS — M545 Low back pain, unspecified: Secondary | ICD-10-CM | POA: Diagnosis not present

## 2023-09-22 ENCOUNTER — Ambulatory Visit: Admitting: Licensed Clinical Social Worker

## 2023-09-23 DIAGNOSIS — M545 Low back pain, unspecified: Secondary | ICD-10-CM | POA: Diagnosis not present

## 2023-09-23 DIAGNOSIS — M7602 Gluteal tendinitis, left hip: Secondary | ICD-10-CM | POA: Diagnosis not present

## 2023-09-27 DIAGNOSIS — M545 Low back pain, unspecified: Secondary | ICD-10-CM | POA: Diagnosis not present

## 2023-09-27 DIAGNOSIS — M7602 Gluteal tendinitis, left hip: Secondary | ICD-10-CM | POA: Diagnosis not present

## 2023-10-06 DIAGNOSIS — M7602 Gluteal tendinitis, left hip: Secondary | ICD-10-CM | POA: Diagnosis not present

## 2023-10-06 DIAGNOSIS — M545 Low back pain, unspecified: Secondary | ICD-10-CM | POA: Diagnosis not present

## 2023-10-13 DIAGNOSIS — M810 Age-related osteoporosis without current pathological fracture: Secondary | ICD-10-CM | POA: Diagnosis not present

## 2023-10-13 DIAGNOSIS — K219 Gastro-esophageal reflux disease without esophagitis: Secondary | ICD-10-CM | POA: Diagnosis not present

## 2023-10-13 DIAGNOSIS — R1310 Dysphagia, unspecified: Secondary | ICD-10-CM | POA: Diagnosis not present

## 2023-10-13 DIAGNOSIS — I700 Atherosclerosis of aorta: Secondary | ICD-10-CM | POA: Diagnosis not present

## 2023-10-13 DIAGNOSIS — M7602 Gluteal tendinitis, left hip: Secondary | ICD-10-CM | POA: Diagnosis not present

## 2023-10-13 DIAGNOSIS — F3340 Major depressive disorder, recurrent, in remission, unspecified: Secondary | ICD-10-CM | POA: Diagnosis not present

## 2023-10-13 DIAGNOSIS — E039 Hypothyroidism, unspecified: Secondary | ICD-10-CM | POA: Diagnosis not present

## 2023-10-13 DIAGNOSIS — Z1389 Encounter for screening for other disorder: Secondary | ICD-10-CM | POA: Diagnosis not present

## 2023-10-13 DIAGNOSIS — E7800 Pure hypercholesterolemia, unspecified: Secondary | ICD-10-CM | POA: Diagnosis not present

## 2023-10-13 DIAGNOSIS — M545 Low back pain, unspecified: Secondary | ICD-10-CM | POA: Diagnosis not present

## 2023-10-13 DIAGNOSIS — Z96611 Presence of right artificial shoulder joint: Secondary | ICD-10-CM | POA: Diagnosis not present

## 2024-02-29 DIAGNOSIS — K580 Irritable bowel syndrome with diarrhea: Secondary | ICD-10-CM | POA: Diagnosis not present

## 2024-03-02 DIAGNOSIS — K580 Irritable bowel syndrome with diarrhea: Secondary | ICD-10-CM | POA: Diagnosis not present

## 2024-04-21 ENCOUNTER — Other Ambulatory Visit (HOSPITAL_COMMUNITY): Payer: Self-pay | Admitting: Internal Medicine

## 2024-04-21 ENCOUNTER — Telehealth (HOSPITAL_COMMUNITY): Payer: Self-pay | Admitting: Pharmacy Technician

## 2024-04-21 NOTE — Telephone Encounter (Signed)
 Auth Submission: APPROVED Site of care: [facility] Payer: [employer] MEDICARE Medication & CPT/J Code(s) submitted: Prolia  (Denosumab ) [id number] Diagnosis Code: M81.0 Route of submission (phone, fax, portal): CMM Phone # Fax # Auth type: Buy/Bill HB Units/visits requested: 60mg  x 2 doses, q 6 months KEY: [id number] Approval from: 10/27/21 to 05/10/25    Dagoberto Armour, CPhT [facility] Phone: [phone] 04/21/2024

## 2024-05-18 ENCOUNTER — Ambulatory Visit (HOSPITAL_COMMUNITY)
Admission: RE | Admit: 2024-05-18 | Discharge: 2024-05-18 | Disposition: A | Discharge status: Home / Self Care | Source: Ambulatory Visit | Attending: Internal Medicine | Admitting: Internal Medicine

## 2024-05-18 DIAGNOSIS — M810 Age-related osteoporosis without current pathological fracture: Secondary | ICD-10-CM | POA: Diagnosis present

## 2024-05-18 MED ORDER — DENOSUMAB 60 MG/ML ~~LOC~~ SOSY
PREFILLED_SYRINGE | SUBCUTANEOUS | Status: AC
  Filled 2024-05-18: qty 1

## 2024-05-18 MED ORDER — DENOSUMAB 60 MG/ML ~~LOC~~ SOSY
60.0000 mg | PREFILLED_SYRINGE | Freq: Once | SUBCUTANEOUS | Status: AC
  Administered 2024-05-18: 60 mg via SUBCUTANEOUS

## 2024-11-16 ENCOUNTER — Encounter (HOSPITAL_COMMUNITY)
